# Patient Record
Sex: Male | Born: 1970
Health system: Southern US, Community
[De-identification: ages and names within clinical notes are randomized; demographics above are authoritative.]

## PROBLEM LIST (undated history)

## (undated) DIAGNOSIS — D649 Anemia, unspecified: Secondary | ICD-10-CM

## (undated) HISTORY — PX: NO PAST SURGERIES: SHX2092

## (undated) HISTORY — PX: WISDOM TOOTH EXTRACTION: SHX21

## (undated) HISTORY — DX: Anemia, unspecified: D64.9

---

## 1999-02-07 ENCOUNTER — Encounter: Payer: Self-pay | Admitting: Emergency Medicine

## 1999-02-07 ENCOUNTER — Emergency Department (HOSPITAL_COMMUNITY): Admission: EM | Admit: 1999-02-07 | Discharge: 1999-02-07 | Payer: Self-pay | Admitting: Emergency Medicine

## 1999-02-10 ENCOUNTER — Encounter: Admission: RE | Admit: 1999-02-10 | Discharge: 1999-02-10 | Payer: Self-pay | Admitting: Family Medicine

## 1999-03-01 ENCOUNTER — Encounter: Admission: RE | Admit: 1999-03-01 | Discharge: 1999-03-01 | Payer: Self-pay | Admitting: Family Medicine

## 2001-11-19 ENCOUNTER — Ambulatory Visit (HOSPITAL_COMMUNITY): Admission: RE | Admit: 2001-11-19 | Discharge: 2001-11-19 | Payer: Self-pay | Admitting: Internal Medicine

## 2011-10-04 ENCOUNTER — Ambulatory Visit (INDEPENDENT_AMBULATORY_CARE_PROVIDER_SITE_OTHER): Payer: Self-pay | Admitting: Family Medicine

## 2011-10-04 VITALS — BP 121/73 | HR 53 | Temp 97.8°F | Resp 18 | Ht 70.0 in | Wt 173.4 lb

## 2011-10-04 DIAGNOSIS — R1011 Right upper quadrant pain: Secondary | ICD-10-CM

## 2011-10-04 DIAGNOSIS — D72819 Decreased white blood cell count, unspecified: Secondary | ICD-10-CM

## 2011-10-04 LAB — POCT CBC
Granulocyte percent: 46.4 %G (ref 37–80)
HCT, POC: 45.2 % (ref 43.5–53.7)
Hemoglobin: 14.5 g/dL (ref 14.1–18.1)
Lymph, poc: 1.7 (ref 0.6–3.4)
MCH, POC: 28.4 pg (ref 27–31.2)
MCHC: 32.1 g/dL (ref 31.8–35.4)
MCV: 88.7 fL (ref 80–97)
MID (cbc): 0.3 (ref 0–0.9)
MPV: 9.3 fL (ref 0–99.8)
POC Granulocyte: 1.7 — AB (ref 2–6.9)
POC LYMPH PERCENT: 45.5 %L (ref 10–50)
POC MID %: 8.1 %M (ref 0–12)
Platelet Count, POC: 247 10*3/uL (ref 142–424)
RBC: 5.1 M/uL (ref 4.69–6.13)
RDW, POC: 13 %
WBC: 3.7 10*3/uL — AB (ref 4.6–10.2)

## 2011-10-04 LAB — LIPID PANEL
Cholesterol: 143 mg/dL (ref 0–200)
HDL: 51 mg/dL (ref >39)
LDL Cholesterol: 82 mg/dL (ref 0–99)
Total CHOL/HDL Ratio: 2.8 Ratio
Triglycerides: 49 mg/dL (ref <150)
VLDL: 10 mg/dL (ref 0–40)

## 2011-10-04 LAB — COMPREHENSIVE METABOLIC PANEL
ALT: 10 U/L (ref 0–53)
AST: 19 U/L (ref 0–37)
Albumin: 4.9 g/dL (ref 3.5–5.2)
Alkaline Phosphatase: 55 U/L (ref 39–117)
BUN: 5 mg/dL — ABNORMAL LOW (ref 6–23)
CO2: 31 mEq/L (ref 19–32)
Calcium: 10.7 mg/dL — ABNORMAL HIGH (ref 8.4–10.5)
Chloride: 103 mEq/L (ref 96–112)
Creat: 0.83 mg/dL (ref 0.50–1.35)
Glucose, Bld: 86 mg/dL (ref 70–99)
Potassium: 4.9 mEq/L (ref 3.5–5.3)
Sodium: 140 mEq/L (ref 135–145)
Total Bilirubin: 0.7 mg/dL (ref 0.3–1.2)
Total Protein: 7.3 g/dL (ref 6.0–8.3)

## 2011-10-04 LAB — POCT UA - MICROSCOPIC ONLY
Bacteria, U Microscopic: NEGATIVE
Casts, Ur, LPF, POC: NEGATIVE
Crystals, Ur, HPF, POC: NEGATIVE
Epithelial cells, urine per micros: NEGATIVE
Mucus, UA: NEGATIVE
RBC, urine, microscopic: NEGATIVE
WBC, Ur, HPF, POC: NEGATIVE
Yeast, UA: NEGATIVE

## 2011-10-04 LAB — POCT URINALYSIS DIPSTICK
Bilirubin, UA: NEGATIVE
Blood, UA: NEGATIVE
Glucose, UA: NEGATIVE
Ketones, UA: NEGATIVE
Leukocytes, UA: NEGATIVE
Nitrite, UA: NEGATIVE
Protein, UA: NEGATIVE
Spec Grav, UA: 1.015
Urobilinogen, UA: 0.2
pH, UA: 7

## 2011-10-04 NOTE — Progress Notes (Addendum)
Patient Name: Antonio Moore Date of Birth: 02/28/1971 Medical Record Number: [id number] Gender: male Date of Encounter: 10/04/2011  History of Present Illness:  Antonio Moore is a 41 y.o. very pleasant male patient who presents with the following:  Here today to evaluate right side pain.  He has noted some RUQ/ right lower rib pain for the last 2 days.  The pain is there all the time but can wax and wane.  No relationship to eating.   No cough, no fever.  He has noted some aches and has not felt great overall- however he has not been severely ill either.   No urinary symptoms.  No blood in his urine. Never had this before.   Not taking any medications for this so far.   Appetitie seems to be ok, but he has been eating carefully.  No nausea, vomiting or diarrhea.   No blood in his stool, he has not been taking excessive NSAIDs  Generally a healthy person- he has lost weight recently by adopting a mostly vegan diet He is not a smoker or an exesssive drinker  He would also like to have any other appropriate screening labs today if possible.  He has only eaten an orange so far today.   There is no problem list on file for this patient.  No past medical history on file. No past surgical history on file. History  Substance Use Topics  . Smoking status: Never Smoker   . Smokeless tobacco: Not on file  . Alcohol Use: Not on file   No family history on file. No Known Allergies  Medication list has been reviewed and updated.  Review of Systems: As per HPI- otherwise negative.  Physical Examination: Filed Vitals:   10/04/11 1348  BP: 121/73  Pulse: 53  Temp: 97.8 F (36.6 C)  TempSrc: Oral  Resp: 18  Height: 5\' 10"  (1.778 m)  Weight: 173 lb 6.4 oz (78.654 kg)    Body mass index is 24.88 kg/(m^2).  GEN: WDWN, NAD, Non-toxic, A & O x 3, looks well HEENT: Atraumatic, Normocephalic. Neck supple. No masses, No LAD.  TM and oropharynx wnl Ears and Nose: No external  deformity. CV: RRR, No M/G/R. No JVD. No thrill. No extra heart sounds. PULM: CTA B, no wheezes, crackles, rhonchi. No retractions. No resp. distress. No accessory muscle use. ABD: S, ND, +BS. No rebound. No HSM.  Mild epigastric tenderness, negative murphy's sign EXTR: No c/c/e NEURO Normal gait.  PSYCH: Normally interactive. Conversant. Not depressed or anxious appearing.  Calm demeanor.  No CVA tenderness  Results for orders placed in visit on 10/04/11  POCT URINALYSIS DIPSTICK      Component Value Range   Color, UA yellow     Clarity, UA clear     Glucose, UA neg     Bilirubin, UA neg     Ketones, UA neg     Spec Grav, UA 1.015     Blood, UA neg     pH, UA 7.0     Protein, UA neg     Urobilinogen, UA 0.2     Nitrite, UA neg     Leukocytes, UA Negative    POCT UA - MICROSCOPIC ONLY      Component Value Range   WBC, Ur, HPF, POC neg     RBC, urine, microscopic neg     Bacteria, U Microscopic neg     Mucus, UA neg     Epithelial cells, urine  per micros neg     Crystals, Ur, HPF, POC neg     Casts, Ur, LPF, POC neg     Yeast, UA neg    POCT CBC      Component Value Range   WBC 3.7 (*) 4.6 - 10.2 (K/uL)   Lymph, poc 1.7  0.6 - 3.4    POC LYMPH PERCENT 45.5  10 - 50 (%L)   MID (cbc) 0.3  0 - 0.9    POC MID % 8.1  0 - 12 (%M)   POC Granulocyte 1.7 (*) 2 - 6.9    Granulocyte percent 46.4  37 - 80 (%G)   RBC 5.10  4.69 - 6.13 (M/uL)   Hemoglobin 14.5  14.1 - 18.1 (g/dL)   HCT, POC 45.4  09.8 - 53.7 (%)   MCV 88.7  80 - 97 (fL)   MCH, POC 28.4  27 - 31.2 (pg)   MCHC 32.1  31.8 - 35.4 (g/dL)   RDW, POC 11.9     Platelet Count, POC 247  142 - 424 (K/uL)   MPV 9.3  0 - 99.8 (fL)    Assessment and Plan: 1. Abdominal pain, RUQ  POCT urinalysis dipstick, POCT UA - Microscopic Only, POCT CBC, Comprehensive metabolic panel, Lipid panel   Await other labs as above- will follow- up pending results.  Suspect thatt Muzamil has gastritis or possibly MSK abdominal wall pain.  As  his symptoms are fairly mild and labs are reassuring, he chose to observe as opposed to doing further evaluation at this time (he is self- pay).  He will take an OTC acid reducer such as tums or pepcid/ zantac. Discussed acidic foods to avoid.  He will call/ RTC if he is getting worse or having any more pain.  Otherwise I will call him in a day or so when his other labs are available.    5/9: called and left detailed message on machine- other labs look ok except mild elevation of calcium.  Will leave order for recheck CBC and Ca+ on chart.  If symptoms continue please call/ rtc- Sooner if worse.

## 2011-10-05 ENCOUNTER — Encounter: Payer: Self-pay | Admitting: Family Medicine

## 2011-10-05 NOTE — Progress Notes (Signed)
Addended by: Abbe Amsterdam C on: 10/05/2011 08:49 AM   Modules accepted: Orders

## 2012-10-09 ENCOUNTER — Encounter: Payer: Self-pay | Admitting: Family Medicine

## 2014-10-12 ENCOUNTER — Telehealth: Payer: Self-pay

## 2014-10-12 ENCOUNTER — Ambulatory Visit (INDEPENDENT_AMBULATORY_CARE_PROVIDER_SITE_OTHER): Payer: Managed Care, Other (non HMO) | Admitting: Emergency Medicine

## 2014-10-12 VITALS — BP 120/72 | HR 56 | Temp 98.4°F | Resp 18 | Ht 72.0 in | Wt 200.0 lb

## 2014-10-12 DIAGNOSIS — J0140 Acute pansinusitis, unspecified: Secondary | ICD-10-CM | POA: Diagnosis not present

## 2014-10-12 DIAGNOSIS — J209 Acute bronchitis, unspecified: Secondary | ICD-10-CM | POA: Diagnosis not present

## 2014-10-12 MED ORDER — HYDROCOD POLST-CPM POLST ER 10-8 MG/5ML PO SUER: 5.0000 mL | Freq: Two times a day (BID) | ORAL | Status: DC

## 2014-10-12 MED ORDER — PSEUDOEPHEDRINE-GUAIFENESIN ER 60-600 MG PO TB12: 1.0000 | ORAL_TABLET | Freq: Two times a day (BID) | ORAL | Status: AC

## 2014-10-12 MED ORDER — AMOXICILLIN-POT CLAVULANATE 875-125 MG PO TABS: 1.0000 | ORAL_TABLET | Freq: Two times a day (BID) | ORAL | Status: DC

## 2014-10-12 NOTE — Patient Instructions (Signed)

## 2014-10-12 NOTE — Progress Notes (Signed)
[facility] [address] [place] [address] [phone]  Date:  10/12/2014   Name:  Antonio Moore   DOB:  Jun 11, 1970   MRN:  [id number]  PCP:  No PCP Per Patient    Chief Complaint: Generalized Body Aches and Cough   History of Present Illness:  Antonio Moore is a 44 y.o. very pleasant male patient who presents with the following:  Ill for a week with nasal congestion and mucopurulent nasal drainage and post nasal drip. No sore throat. Has a cough productive purulent sputum.   No wheezing or shortness of breath Patient denies fever or chills. Patient denies nausea or vomiting No rash No improvement with over the counter medications or other home remedies.  Denies other complaint or health concern today.   There are no active problems to display for this patient.   History reviewed. No pertinent past medical history.  Past Surgical History  Procedure Laterality Date  . Wisdom tooth extraction      History  Substance Use Topics  . Smoking status: Never Smoker   . Smokeless tobacco: Not on file  . Alcohol Use: Not on file    Family History  Problem Relation Age of Onset  . Stroke Mother     Allergies  Allergen Reactions  . Bee Venom     Medication list has been reviewed and updated.  No current outpatient prescriptions on file prior to visit.   No current facility-administered medications on file prior to visit.    Review of Systems:  Review of Systems  Constitutional: Negative for fever, chills and fatigue.  HENT: Negative for congestion, ear pain, hearing loss, postnasal drip, rhinorrhea and sinus pressure.   Eyes: Negative for discharge and redness.  Respiratory: Negative for cough, shortness of breath and wheezing.   Cardiovascular: Negative for chest pain and leg swelling.  Gastrointestinal: Negative for nausea, vomiting, abdominal pain, constipation and blood in stool.  Genitourinary: Negative for dysuria, urgency and  frequency.  Musculoskeletal: Negative for neck stiffness.  Skin: Negative for rash.  Neurological: Negative for seizures, weakness and headaches.   Physical Examination: Filed Vitals:   10/12/14 1357  BP: 120/72  Pulse: 56  Temp: 98.4 F (36.9 C)  Resp: 18   Filed Vitals:   10/12/14 1357  Height: 6' (1.829 m)  Weight: 200 lb (90.719 kg)   Body mass index is 27.12 kg/(m^2). Ideal Body Weight: Weight in (lb) to have BMI = 25: 183.9  GEN: WDWN, NAD, Non-toxic, A & O x 3 HEENT: Atraumatic, Normocephalic. Neck supple. No masses, No LAD. Ears and Nose: No external deformity. CV: RRR, No M/G/R. No JVD. No thrill. No extra heart sounds. PULM: CTA B, no wheezes, crackles, rhonchi. No retractions. No resp. distress. No accessory muscle use. ABD: S, NT, ND, +BS. No rebound. No HSM. EXTR: No c/c/e NEURO Normal gait.  PSYCH: Normally interactive. Conversant. Not depressed or anxious appearing.  Calm demeanor.    Assessment and Plan: 1. Acute bronchitis, unspecified organism   - amoxicillin-clavulanate (AUGMENTIN) 875-125 MG per tablet; Take 1 tablet by mouth 2 (two) times daily.  Dispense: 20 tablet; Refill: 0 - pseudoephedrine-guaifenesin (MUCINEX D) 60-600 MG per tablet; Take 1 tablet by mouth every 12 (twelve) hours.  Dispense: 18 tablet; Refill: 0 - chlorpheniramine-HYDROcodone (TUSSIONEX PENNKINETIC ER) 10-8 MG/5ML SUER; Take 5 mLs by mouth 2 (two) times daily.  Dispense: 60 mL; Refill: 0  2. Acute pansinusitis, recurrence not specified   -  amoxicillin-clavulanate (AUGMENTIN) 875-125 MG per tablet; Take 1 tablet by mouth 2 (two) times daily.  Dispense: 20 tablet; Refill: 0 - pseudoephedrine-guaifenesin (MUCINEX D) 60-600 MG per tablet; Take 1 tablet by mouth every 12 (twelve) hours.  Dispense: 18 tablet; Refill: 0 - chlorpheniramine-HYDROcodone (TUSSIONEX PENNKINETIC ER) 10-8 MG/5ML SUER; Take 5 mLs by mouth 2 (two) times daily.  Dispense: 60 mL; Refill: 0    Signed Ellison Carwin, MD

## 2014-10-12 NOTE — Telephone Encounter (Signed)
Called pt, advised he would be contagious but if he feels well than he can just work and use sanitary hand measures to prevent transmission of infection. Advised to take the ABX as prescribed.

## 2014-10-12 NOTE — Telephone Encounter (Signed)
Patient is requesting information spreading sinusitis. Is It Contagious?   Patient was seen today and needs to inform his employer.  [phone] (H)

## 2016-05-16 ENCOUNTER — Ambulatory Visit (INDEPENDENT_AMBULATORY_CARE_PROVIDER_SITE_OTHER): Payer: PRIVATE HEALTH INSURANCE | Admitting: Family Medicine

## 2016-05-16 VITALS — BP 116/74 | HR 56 | Temp 98.5°F | Resp 18 | Ht 72.0 in | Wt 224.0 lb

## 2016-05-16 DIAGNOSIS — Z0000 Encounter for general adult medical examination without abnormal findings: Secondary | ICD-10-CM | POA: Diagnosis not present

## 2016-05-16 DIAGNOSIS — Z6830 Body mass index (BMI) 30.0-30.9, adult: Secondary | ICD-10-CM | POA: Diagnosis not present

## 2016-05-16 DIAGNOSIS — Z1329 Encounter for screening for other suspected endocrine disorder: Secondary | ICD-10-CM | POA: Diagnosis not present

## 2016-05-16 DIAGNOSIS — Z1383 Encounter for screening for respiratory disorder NEC: Secondary | ICD-10-CM

## 2016-05-16 DIAGNOSIS — Z136 Encounter for screening for cardiovascular disorders: Secondary | ICD-10-CM

## 2016-05-16 DIAGNOSIS — Z800 Family history of malignant neoplasm of digestive organs: Secondary | ICD-10-CM

## 2016-05-16 DIAGNOSIS — Z113 Encounter for screening for infections with a predominantly sexual mode of transmission: Secondary | ICD-10-CM

## 2016-05-16 DIAGNOSIS — Z130 Encounter for screening for diseases of the blood and blood-forming organs and certain disorders involving the immune mechanism: Secondary | ICD-10-CM

## 2016-05-16 DIAGNOSIS — E66811 Other obesity due to excess calories: Secondary | ICD-10-CM

## 2016-05-16 DIAGNOSIS — Z1389 Encounter for screening for other disorder: Secondary | ICD-10-CM

## 2016-05-16 DIAGNOSIS — E6609 Other obesity due to excess calories: Secondary | ICD-10-CM

## 2016-05-16 DIAGNOSIS — R1013 Epigastric pain: Secondary | ICD-10-CM

## 2016-05-16 LAB — POCT URINALYSIS DIP (MANUAL ENTRY)
BILIRUBIN UA: NEGATIVE
Glucose, UA: NEGATIVE
Ketones, POC UA: NEGATIVE
Leukocytes, UA: NEGATIVE
Nitrite, UA: NEGATIVE
PH UA: 6
Protein Ur, POC: NEGATIVE
RBC UA: NEGATIVE
SPEC GRAV UA: 1.015
Urobilinogen, UA: 0.2

## 2016-05-16 MED ORDER — PANTOPRAZOLE SODIUM 40 MG PO TBEC: 40.0000 mg | DELAYED_RELEASE_TABLET | Freq: Every day | ORAL | 1 refills | Status: DC

## 2016-05-16 NOTE — Progress Notes (Signed)
Subjective:  By signing my name below, I, Essence Howell, attest that this documentation has been prepared under the direction and in the presence of Delman Cheadle, MD Electronically Signed: Ladene Artist, ED Scribe 05/16/2016 at 8:32 AM.   Patient ID: Antonio Moore, male    DOB: 1970/12/01, 45 y.o.   MRN: [id number]  Chief Complaint  Patient presents with  . Annual Exam   HPI HPI Comments: Antonio Moore is a 45 y.o. male who presents to the [facility] for an annual exam.   Pt has noticed intermittent epigastric pain that radiates into his right chest over the past few months. Pt describes pain as pressure that seems to be exacerbated with eating but he denies irritation with consuming certain foods. He also reports gradual weight gain. Pt denies nausea, vomiting, changes in bowels or bladder, difficulty urinating, sob, any other chest pain. He reports experience heart burn once but none since. Pt denies h/o GERD, stomach ulcers or abdominal surgeries. He is a nonsmoker. Pt expresses his concerns since his brother recently passed from pancreatic caner at age 66 and was overweight.   Immunizations Pt's last tetanus is unknown. He declines the vaccine at this visit.   History reviewed. No pertinent past medical history. No current outpatient prescriptions on file prior to visit.   No current facility-administered medications on file prior to visit.    Allergies  Allergen Reactions  . Bee Venom    Past Surgical History:  Procedure Laterality Date  . WISDOM TOOTH EXTRACTION     Family History  Problem Relation Age of Onset  . Stroke Mother   . Cancer Brother    Social History   Social History  . Marital status: Married    Spouse name: N/A  . Number of children: N/A  . Years of education: N/A   Social History Main Topics  . Smoking status: Never Smoker  . Smokeless tobacco: Never Used  . Alcohol use No  . Drug use: No  . Sexual activity: Not Asked    Other Topics Concern  . None   Social History Narrative  . None   Depression screen [facility] 2/9 05/16/2016  Decreased Interest 0  Down, Depressed, Hopeless 0  PHQ - 2 Score 0    Review of Systems  HENT: Positive for sinus pressure.   Respiratory: Negative for shortness of breath.   Cardiovascular: Positive for chest pain.  Gastrointestinal: Positive for abdominal pain. Negative for blood in stool, constipation, diarrhea, nausea and vomiting.  Genitourinary: Negative for difficulty urinating.  All other systems reviewed and are negative.     Objective:   Physical Exam  Constitutional: He is oriented to person, place, and time. He appears well-developed and well-nourished. No distress.  HENT:  Head: Normocephalic and atraumatic.  Right Ear: Tympanic membrane normal.  Left Ear: Tympanic membrane normal.  Nose: Nose normal.  Mouth/Throat: Oropharynx is clear and moist.  Eyes: Conjunctivae and EOM are normal.  Neck: Neck supple. No tracheal deviation present.  Cardiovascular: Normal rate, regular rhythm, S1 normal and S2 normal.  Exam reveals no gallop and no friction rub.   No murmur heard. Pulmonary/Chest: Effort normal. No respiratory distress.  Good air movement. Lungs are clear to auscultation.  Abdominal: Soft. Bowel sounds are normal.  Musculoskeletal: Normal range of motion.  Lymphadenopathy:       Head (right side): Tonsillar (mild) adenopathy present.       Head (left side): Tonsillar (mild) adenopathy  present.    He has no cervical adenopathy.       Right cervical: No posterior cervical adenopathy present.      Left cervical: No posterior cervical adenopathy present.  Neurological: He is alert and oriented to person, place, and time.  Skin: Skin is warm and dry.  Psychiatric: He has a normal mood and affect. His behavior is normal.  Nursing note and vitals reviewed.  BP 116/74 (BP Location: Right Arm, Patient Position: Sitting, Cuff Size: Small)   Pulse (!) 56    Temp 98.5 F (36.9 C) (Oral)   Resp 18   Ht 6' (1.829 m)   Wt 224 lb (101.6 kg)   SpO2 99%   BMI 30.38 kg/m      Results for orders placed or performed in visit on 05/16/16  POCT urinalysis dipstick  Result Value Ref Range   Color, UA yellow yellow   Clarity, UA clear clear   Glucose, UA negative negative   Bilirubin, UA negative negative   Ketones, POC UA negative negative   Spec Grav, UA 1.015    Blood, UA negative negative   pH, UA 6.0    Protein Ur, POC negative negative   Urobilinogen, UA 0.2    Nitrite, UA Negative Negative   Leukocytes, UA Negative Negative    Assessment & Plan:   1. Annual physical exam   2. Screening for cardiovascular, respiratory, and genitourinary diseases   3. Screening for deficiency anemia   4. Screening for thyroid disorder   5. Abdominal pain, epigastric - start ppi, check labs and [place].  6. Class 1 obesity due to excess calories without serious comorbidity with body mass index (BMI) of 30.0 to 30.9 in adult - pt going to focus on diet/exercise  7. Screening for STD (sexually transmitted disease) - no risk, routine one time hiv screen  8. Family history of pancreatic cancer - in brother, pt now with similar sxs that are causing sig anxiety/concern so need to image pancreas, need to r/o cholelithiasis or fatty liver as pain etiology    Orders Placed This Encounter  Procedures  . US Abdomen Complete    EPIC ORDER WT-220/NO NEEDS [facility]    Standing Status:   Future    Standing Expiration Date:   07/17/2017    Order Specific Question:   Reason for Exam (SYMPTOM  OR DIAGNOSIS REQUIRED)    Answer:   epigastric and RUQ pain and fullness for mos, brother passed from pancreatic cancer with similar pain    Order Specific Question:   Preferred imaging location?    Answer:   [facility]  . CBC  . Comprehensive metabolic panel    Order Specific Question:   Has the patient fasted?    Answer:   Yes  . TSH  . Lipid  panel    Order Specific Question:   Has the patient fasted?    Answer:   Yes  . HIV antibody  . Lipase  . Care order/instruction:    AVS and GO    Scheduling Instructions:     AVS and GO  . POCT urinalysis dipstick     I personally performed the services described in this documentation, which was scribed in my presence. The recorded information has been reviewed and considered, and addended by me as needed.   Delman Cheadle, M.D.  [facility] [address] [place], [place] [address] [phone] phone [phone] fax  05/16/16 9:05 PM

## 2016-05-16 NOTE — Patient Instructions (Addendum)
IF you received an x-ray today, you will receive an invoice from [facility]. Please contact [facility] at [phone] with questions or concerns regarding your invoice.   IF you received labwork today, you will receive an invoice from [facility]. Please contact [facility] at [phone] with questions or concerns regarding your invoice.   Our billing staff will not be able to assist you with questions regarding bills from these companies.  You will be contacted with the lab results as soon as they are available. The fastest way to get your results is to activate your My Chart account. Instructions are located on the last page of this paperwork. If you have not heard from [place] regarding the results in 2 weeks, please contact this office.    Health Maintenance, Male A healthy lifestyle and preventative care can promote health and wellness.  Maintain regular health, dental, and eye exams.  Eat a healthy diet. Foods like vegetables, fruits, whole grains, low-fat dairy products, and lean protein foods contain the nutrients you need and are low in calories. Decrease your intake of foods high in solid fats, added sugars, and salt. Get information about a proper diet from your health care provider, if necessary.  Regular physical exercise is one of the most important things you can do for your health. Most adults should get at least 150 minutes of moderate-intensity exercise (any activity that increases your heart rate and causes you to sweat) each week. In addition, most adults need muscle-strengthening exercises on 2 or more days a week.   Maintain a healthy weight. The body mass index (BMI) is a screening tool to identify possible weight problems. It provides an estimate of body fat based on height and weight. Your health care provider can find your BMI and can help you achieve or maintain a healthy weight. For males 20 years and older:  A BMI below 18.5 is considered  underweight.  A BMI of 18.5 to 24.9 is normal.  A BMI of 25 to 29.9 is considered overweight.  A BMI of 30 and above is considered obese.  Maintain normal blood lipids and cholesterol by exercising and minimizing your intake of saturated fat. Eat a balanced diet with plenty of fruits and vegetables. Blood tests for lipids and cholesterol should begin at age 90 and be repeated every 5 years. If your lipid or cholesterol levels are high, you are over age 51, or you are at high risk for heart disease, you may need your cholesterol levels checked more frequently.Ongoing high lipid and cholesterol levels should be treated with medicines if diet and exercise are not working.  If you smoke, find out from your health care provider how to quit. If you do not use tobacco, do not start.  Lung cancer screening is recommended for adults aged 57-80 years who are at high risk for developing lung cancer because of a history of smoking. A yearly low-dose CT scan of the lungs is recommended for people who have at least a 30-pack-year history of smoking and are current smokers or have quit within the past 15 years. A pack year of smoking is smoking an average of 1 pack of cigarettes a day for 1 year (for example, a 30-pack-year history of smoking could mean smoking 1 pack a day for 30 years or 2 packs a day for 15 years). Yearly screening should continue until the smoker has stopped smoking for at least 15 years. Yearly screening should be stopped for people who  develop a health problem that would prevent them from having lung cancer treatment.  If you choose to drink alcohol, do not have more than 2 drinks per day. One drink is considered to be 12 oz (360 mL) of beer, 5 oz (150 mL) of wine, or 1.5 oz (45 mL) of liquor.  Avoid the use of street drugs. Do not share needles with anyone. Ask for help if you need support or instructions about stopping the use of drugs.  High blood pressure causes heart disease and  increases the risk of stroke. High blood pressure is more likely to develop in:  People who have blood pressure in the end of the normal range (100-139/85-89 mm Hg).  People who are overweight or obese.  People who are African American.  If you are 29-77 years of age, have your blood pressure checked every 3-5 years. If you are 16 years of age or older, have your blood pressure checked every year. You should have your blood pressure measured twice-once when you are at a hospital or clinic, and once when you are not at a hospital or clinic. Record the average of the two measurements. To check your blood pressure when you are not at a hospital or clinic, you can use:  An automated blood pressure machine at a pharmacy.  A home blood pressure monitor.  If you are 33-49 years old, ask your health care provider if you should take aspirin to prevent heart disease.  Diabetes screening involves taking a blood sample to check your fasting blood sugar level. This should be done once every 3 years after age 8 if you are at a normal weight and without risk factors for diabetes. Testing should be considered at a younger age or be carried out more frequently if you are overweight and have at least 1 risk factor for diabetes.  Colorectal cancer can be detected and often prevented. Most routine colorectal cancer screening begins at the age of 80 and continues through age 26. However, your health care provider may recommend screening at an earlier age if you have risk factors for colon cancer. On a yearly basis, your health care provider may provide home test kits to check for hidden blood in the stool. A small camera at the end of a tube may be used to directly examine the colon (sigmoidoscopy or colonoscopy) to detect the earliest forms of colorectal cancer. Talk to your health care provider about this at age 76 when routine screening begins. A direct exam of the colon should be repeated every 5-10 years through  age 72, unless early forms of precancerous polyps or small growths are found.  People who are at an increased risk for hepatitis B should be screened for this virus. You are considered at high risk for hepatitis B if:  You were born in a country where hepatitis B occurs often. Talk with your health care provider about which countries are considered high risk.  Your parents were born in a high-risk country and you have not received a shot to protect against hepatitis B (hepatitis B vaccine).  You have HIV or AIDS.  You use needles to inject street drugs.  You live with, or have sex with, someone who has hepatitis B.  You are a man who has sex with other men (MSM).  You get hemodialysis treatment.  You take certain medicines for conditions like cancer, organ transplantation, and autoimmune conditions.  Hepatitis C blood testing is recommended for all people born  from [place] through 1965 and any individual with known risk factors for hepatitis C.  Healthy men should no longer receive prostate-specific antigen (PSA) blood tests as part of routine cancer screening. Talk to your health care provider about prostate cancer screening.  Testicular cancer screening is not recommended for adolescents or adult males who have no symptoms. Screening includes self-exam, a health care provider exam, and other screening tests. Consult with your health care provider about any symptoms you have or any concerns you have about testicular cancer.  Practice safe sex. Use condoms and avoid high-risk sexual practices to reduce the spread of sexually transmitted infections (STIs).  You should be screened for STIs, including gonorrhea and chlamydia if:  You are sexually active and are younger than 24 years.  You are older than 24 years, and your health care provider tells you that you are at risk for this type of infection.  Your sexual activity has changed since you were last screened, and you are at an  increased risk for chlamydia or gonorrhea. Ask your health care provider if you are at risk.  If you are at risk of being infected with HIV, it is recommended that you take a prescription medicine daily to prevent HIV infection. This is called pre-exposure prophylaxis (PrEP). You are considered at risk if:  You are a man who has sex with other men (MSM).  You are a heterosexual man who is sexually active with multiple partners.  You take drugs by injection.  You are sexually active with a partner who has HIV.  Talk with your health care provider about whether you are at high risk of being infected with HIV. If you choose to begin PrEP, you should first be tested for HIV. You should then be tested every 3 months for as long as you are taking PrEP.  Use sunscreen. Apply sunscreen liberally and repeatedly throughout the day. You should seek shade when your shadow is shorter than you. Protect yourself by wearing long sleeves, pants, a wide-brimmed hat, and sunglasses year round whenever you are outdoors.  Tell your health care provider of new moles or changes in moles, especially if there is a change in shape or color. Also, tell your health care provider if a mole is larger than the size of a pencil eraser.  A one-time screening for abdominal aortic aneurysm (AAA) and surgical repair of large AAAs by ultrasound is recommended for men aged 59-75 years who are current or former smokers.  Stay current with your vaccines (immunizations). This information is not intended to replace advice given to you by your health care provider. Make sure you discuss any questions you have with your health care provider. Document Released: 11/11/2007 Document Revised: 06/05/2014 Document Reviewed: 02/16/2015 Elsevier Interactive Patient Education  2017 [employer].

## 2016-05-17 LAB — LIPID PANEL
CHOLESTEROL TOTAL: 165 mg/dL (ref 100–199)
Chol/HDL Ratio: 3 ratio units (ref 0.0–5.0)
HDL: 55 mg/dL (ref >39)
LDL Calculated: 96 mg/dL (ref 0–99)
Triglycerides: 72 mg/dL (ref 0–149)
VLDL Cholesterol Cal: 14 mg/dL (ref 5–40)

## 2016-05-17 LAB — COMPREHENSIVE METABOLIC PANEL
A/G RATIO: 1.8 (ref 1.2–2.2)
ALT: 15 IU/L (ref 0–44)
AST: 20 IU/L (ref 0–40)
Albumin: 4.6 g/dL (ref 3.5–5.5)
Alkaline Phosphatase: 104 IU/L (ref 39–117)
BUN/Creatinine Ratio: 13 (ref 9–20)
BUN: 12 mg/dL (ref 6–24)
Bilirubin Total: 0.4 mg/dL (ref 0.0–1.2)
CALCIUM: 9.2 mg/dL (ref 8.7–10.2)
CHLORIDE: 97 mmol/L (ref 96–106)
CO2: 27 mmol/L (ref 18–29)
Creatinine, Ser: 0.92 mg/dL (ref 0.76–1.27)
GFR calc Af Amer: 115 mL/min/{1.73_m2} (ref >59)
GFR, EST NON AFRICAN AMERICAN: 99 mL/min/{1.73_m2} (ref >59)
GLUCOSE: 93 mg/dL (ref 65–99)
Globulin, Total: 2.5 g/dL (ref 1.5–4.5)
Potassium: 4.6 mmol/L (ref 3.5–5.2)
Sodium: 140 mmol/L (ref 134–144)
TOTAL PROTEIN: 7.1 g/dL (ref 6.0–8.5)

## 2016-05-17 LAB — CBC
Hematocrit: 42.2 % (ref 37.5–51.0)
Hemoglobin: 14.2 g/dL (ref 13.0–17.7)
MCH: 29.3 pg (ref 26.6–33.0)
MCHC: 33.6 g/dL (ref 31.5–35.7)
MCV: 87 fL (ref 79–97)
PLATELETS: 216 10*3/uL (ref 150–379)
RBC: 4.85 x10E6/uL (ref 4.14–5.80)
RDW: 14.4 % (ref 12.3–15.4)
WBC: 4.4 10*3/uL (ref 3.4–10.8)

## 2016-05-17 LAB — HIV ANTIBODY (ROUTINE TESTING W REFLEX): HIV Screen 4th Generation wRfx: NONREACTIVE

## 2016-05-17 LAB — TSH: TSH: 2.81 u[IU]/mL (ref 0.450–4.500)

## 2016-05-17 LAB — LIPASE: Lipase: 20 U/L (ref 13–78)

## 2016-05-30 ENCOUNTER — Ambulatory Visit
Admission: RE | Admit: 2016-05-30 | Discharge: 2016-05-30 | Disposition: A | Discharge status: Home / Self Care | Payer: PRIVATE HEALTH INSURANCE | Source: Ambulatory Visit | Attending: Family Medicine | Admitting: Family Medicine

## 2016-05-30 DIAGNOSIS — R1013 Epigastric pain: Secondary | ICD-10-CM

## 2016-05-30 DIAGNOSIS — Z800 Family history of malignant neoplasm of digestive organs: Secondary | ICD-10-CM

## 2017-12-20 ENCOUNTER — Ambulatory Visit (INDEPENDENT_AMBULATORY_CARE_PROVIDER_SITE_OTHER): Payer: BC Managed Care – PPO | Admitting: Family Medicine

## 2017-12-20 ENCOUNTER — Encounter: Payer: Self-pay | Admitting: Family Medicine

## 2017-12-20 ENCOUNTER — Other Ambulatory Visit: Payer: Self-pay

## 2017-12-20 VITALS — BP 112/71 | HR 60 | Temp 97.7°F | Ht 72.0 in | Wt 201.8 lb

## 2017-12-20 DIAGNOSIS — Z0000 Encounter for general adult medical examination without abnormal findings: Secondary | ICD-10-CM

## 2017-12-20 DIAGNOSIS — Z136 Encounter for screening for cardiovascular disorders: Secondary | ICD-10-CM

## 2017-12-20 DIAGNOSIS — Z1389 Encounter for screening for other disorder: Secondary | ICD-10-CM

## 2017-12-20 DIAGNOSIS — Z1383 Encounter for screening for respiratory disorder NEC: Secondary | ICD-10-CM | POA: Diagnosis not present

## 2017-12-20 DIAGNOSIS — Z125 Encounter for screening for malignant neoplasm of prostate: Secondary | ICD-10-CM

## 2017-12-20 DIAGNOSIS — Z800 Family history of malignant neoplasm of digestive organs: Secondary | ICD-10-CM

## 2017-12-20 DIAGNOSIS — Z23 Encounter for immunization: Secondary | ICD-10-CM | POA: Diagnosis not present

## 2017-12-20 DIAGNOSIS — Z1329 Encounter for screening for other suspected endocrine disorder: Secondary | ICD-10-CM

## 2017-12-20 DIAGNOSIS — H6121 Impacted cerumen, right ear: Secondary | ICD-10-CM

## 2017-12-20 DIAGNOSIS — Z130 Encounter for screening for diseases of the blood and blood-forming organs and certain disorders involving the immune mechanism: Secondary | ICD-10-CM

## 2017-12-20 LAB — POCT URINALYSIS DIP (MANUAL ENTRY)
BILIRUBIN UA: NEGATIVE
BILIRUBIN UA: NEGATIVE mg/dL
Blood, UA: NEGATIVE
GLUCOSE UA: NEGATIVE mg/dL
LEUKOCYTES UA: NEGATIVE
Nitrite, UA: NEGATIVE
Protein Ur, POC: NEGATIVE mg/dL
SPEC GRAV UA: 1.025 (ref 1.010–1.025)
Urobilinogen, UA: 0.2 E.U./dL
pH, UA: 7 (ref 5.0–8.0)

## 2017-12-20 NOTE — Patient Instructions (Signed)

## 2017-12-20 NOTE — Progress Notes (Addendum)
Subjective:  By signing my name below, I, Antonio Moore, attest that this documentation has been prepared under the direction and in the presence of Antonio Cheadle, MD. Electronically Signed: [name], [place]. 12/20/2017 , 4:48 PM .  Patient was seen in Room 1 .   Patient ID: HAPPY [place], male    DOB: 04/05/1971, 47 y.o.   MRN: [id number] Chief Complaint  Patient presents with  . Annual Exam    pt states he is doing well, he is not fasting    HPI  He is not fasting today.   Lipid screening Lab Results  Component Value Date   CHOL 165 05/16/2016   HDL 55 05/16/2016   LDLCALC 96 05/16/2016   TRIG 72 05/16/2016   CHOLHDL 3.0 05/16/2016    Primary Preventative Screenings: Prostate Cancer: He agrees to prostate exam today.  Family history: brother passed away from pancreatic cancer 2 years ago.  STI screening:  Tobacco use/AAA/Lung/EtOH/Illicit substances:  Cardiac: He mentioned a history of fluttery palpitations, but noted he was under a lot of stress and lack of rest at that time; symptoms resolved after rest.  Weight/Moore sugar/Diet/Exercise: He's lost around 20 lbs, and working to return to his baseline of 180s. He's been doing intermittent fasting. He just started exercising again about 3 weeks ago.  BMI Readings from Last 3 Encounters:  12/20/17 27.37 kg/m  05/16/16 30.38 kg/m  10/12/14 27.12 kg/m    OTC/Vit/Supp/Herbal: he takes omega XL supplement, similar to fish oil.  Dentist/Optho: he wears contacts, followed by eye doctor Immunizations:   There is no immunization history on file for this patient.   Tetanus vaccine: He doesn't recall his last tetanus shot.   Chronic Medical Conditions: Decreased sleep: he's been busy over the past week with high school reunions and other social outings recently.   History of abdominal strain: He was working out and doing a lunge a few years ago, felt like he pulled something. He denies seeing a bulge in his abdomen. He  denies any abdominal symptoms currently.   No past medical history on file. Past Surgical History:  Procedure Laterality Date  . WISDOM TOOTH EXTRACTION     No current outpatient medications on file prior to visit.   No current facility-administered medications on file prior to visit.    Allergies  Allergen Reactions  . Bee Venom   . Wasp Venom    Family History  Problem Relation Age of Onset  . Stroke Mother   . Stroke Father   . Cancer Brother    Social History   Socioeconomic History  . Marital status: Married    Spouse name: Not on file  . Number of children: Not on file  . Years of education: Not on file  . Highest education level: Not on file  Occupational History  . Not on file  Social Needs  . Financial resource strain: Not on file  . Food insecurity:    Worry: Not on file    Inability: Not on file  . Transportation needs:    Medical: Not on file    Non-medical: Not on file  Tobacco Use  . Smoking status: Never Smoker  . Smokeless tobacco: Never Used  Substance and Sexual Activity  . Alcohol use: No  . Drug use: No  . Sexual activity: Not on file  Lifestyle  . Physical activity:    Days per week: Not on file    Minutes per session: Not on file  .  Stress: Not on file  Relationships  . Social connections:    Talks on phone: Not on file    Gets together: Not on file    Attends religious service: Not on file    Active member of club or organization: Not on file    Attends meetings of clubs or organizations: Not on file    Relationship status: Not on file  Other Topics Concern  . Not on file  Social History Narrative  . Not on file   Depression screen [facility] 2/9 12/20/2017 05/16/2016  Decreased Interest 0 0  Down, Depressed, Hopeless 0 0  PHQ - 2 Score 0 0   Review of Systems  Constitutional: Negative for fatigue and unexpected weight change.  Eyes: Negative for visual disturbance.  Respiratory: Negative for cough, chest tightness and shortness  of breath.   Cardiovascular: Negative for chest pain, palpitations and leg swelling.  Gastrointestinal: Negative for abdominal pain and Moore in stool.  Neurological: Negative for dizziness, light-headedness and headaches.  All other systems reviewed and are negative.      Objective:   Physical Exam  Constitutional: He is oriented to person, place, and time. He appears well-developed and well-nourished. No distress.  HENT:  Head: Normocephalic and atraumatic.  Right ear: ball of cerumen   Eyes: Pupils are equal, round, and reactive to light. EOM are normal.  Neck: Neck supple.  Cardiovascular: Normal rate and regular rhythm.  Pulmonary/Chest: Effort normal and breath sounds normal. No respiratory distress.  Abdominal: There is negative Murphy's sign. Hernia confirmed negative in the right inguinal area and confirmed negative in the left inguinal area.  Genitourinary: Prostate normal. Prostate is not enlarged and not tender.  Musculoskeletal: Normal range of motion.  Lymphadenopathy: No inguinal adenopathy noted on the right or left side.  Neurological: He is alert and oriented to person, place, and time.  Skin: Skin is warm and dry.  Psychiatric: He has a normal mood and affect. His behavior is normal.  Nursing note and vitals reviewed.   BP 112/71 (BP Location: Right Arm, Patient Position: Sitting, Cuff Size: Normal)   Pulse 60   Temp 97.7 F (36.5 C) (Oral)   Ht 6' (1.829 m)   Wt 201 lb 12.8 oz (91.5 kg)   SpO2 100%   BMI 27.37 kg/m    Wt Readings from Last 3 Encounters:  12/20/17 201 lb 12.8 oz (91.5 kg)  05/16/16 224 lb (101.6 kg)  10/12/14 200 lb (90.7 kg)    Visual Acuity Screening   Right eye Left eye Both eyes  Without correction:     With correction: 20/13 20/13 20/13    EKG: Sinus Bradycardia HR 51 bpm, no acute ischemic changes noted. No prior EKG for comparison.  I have personally reviewed the EKG tracing and agree with the computer interpretation. Sinus   Bradycardia  WITHIN NORMAL LIMITS     Assessment & Plan:    1. Annual physical exam   2. Screening for cardiovascular, respiratory, and genitourinary diseases   3. Screening for prostate cancer   4. Family history of pancreatic cancer   5. Screening for deficiency anemia   6. Screening for thyroid disorder   7. Cerumen debris on tympanic membrane of right ear     Orders Placed This Encounter  Procedures  . Tdap vaccine greater than or equal to 7yo IM  . TSH  . Comprehensive metabolic panel  . CBC  . Lipase  . PSA  . PSA  . Lipase  .  POCT urinalysis dipstick  . EKG 12-Lead     I personally performed the services described in this documentation, which was scribed in my presence. The recorded information has been reviewed and considered, and addended by me as needed.   Antonio Moore, M.D.  Primary Care at [facility] [address] [place], [place] [address] [phone] phone [phone] fax  02/05/18 12:16 AM

## 2017-12-21 LAB — PSA: PROSTATE SPECIFIC AG, SERUM: 0.6 ng/mL (ref 0.0–4.0)

## 2017-12-21 LAB — COMPREHENSIVE METABOLIC PANEL
A/G RATIO: 2 (ref 1.2–2.2)
ALBUMIN: 4.5 g/dL (ref 3.5–5.5)
ALT: 12 IU/L (ref 0–44)
AST: 18 IU/L (ref 0–40)
Alkaline Phosphatase: 61 IU/L (ref 39–117)
BUN / CREAT RATIO: 13 (ref 9–20)
BUN: 12 mg/dL (ref 6–24)
Bilirubin Total: 0.2 mg/dL (ref 0.0–1.2)
CALCIUM: 9.4 mg/dL (ref 8.7–10.2)
CO2: 27 mmol/L (ref 20–29)
CREATININE: 0.96 mg/dL (ref 0.76–1.27)
Chloride: 104 mmol/L (ref 96–106)
GFR calc Af Amer: 108 mL/min/{1.73_m2} (ref >59)
GFR, EST NON AFRICAN AMERICAN: 93 mL/min/{1.73_m2} (ref >59)
GLOBULIN, TOTAL: 2.3 g/dL (ref 1.5–4.5)
Glucose: 86 mg/dL (ref 65–99)
POTASSIUM: 4.2 mmol/L (ref 3.5–5.2)
SODIUM: 143 mmol/L (ref 134–144)
Total Protein: 6.8 g/dL (ref 6.0–8.5)

## 2017-12-21 LAB — CBC
Hematocrit: 41.6 % (ref 37.5–51.0)
Hemoglobin: 13.9 g/dL (ref 13.0–17.7)
MCH: 30.2 pg (ref 26.6–33.0)
MCHC: 33.4 g/dL (ref 31.5–35.7)
MCV: 90 fL (ref 79–97)
PLATELETS: 218 10*3/uL (ref 150–450)
RBC: 4.6 x10E6/uL (ref 4.14–5.80)
RDW: 13.8 % (ref 12.3–15.4)
WBC: 4.2 10*3/uL (ref 3.4–10.8)

## 2017-12-21 LAB — TSH: TSH: 3.12 u[IU]/mL (ref 0.450–4.500)

## 2017-12-21 LAB — LIPASE: Lipase: 27 U/L (ref 13–78)

## 2018-01-30 ENCOUNTER — Ambulatory Visit: Payer: Self-pay | Admitting: *Deleted

## 2018-01-30 ENCOUNTER — Telehealth: Payer: Self-pay | Admitting: Family Medicine

## 2018-01-30 NOTE — Telephone Encounter (Signed)
Attempted to contact pt to assess symptoms; left message on voicemail [phone]; unable to complete nurse triage at this time

## 2018-01-30 NOTE — Telephone Encounter (Signed)
Pt called with C/O soreness at injection site rt shoulder from Tdap given in July at his office visit. Pt states the pain does not keep him from activity but he is concerned that it has lingered this long.  He also describes marks from the [place] that have remained.Pt denies redness and swelling at the site. He denies having drainage from the injection site at any time. He never had a fever but had a 24 hour time he just didn't feel well. Appointment made and care advice given.  Reason for Disposition . [1] Pain, tenderness, or swelling at the injection site AND [2] persists > 3 days  Answer Assessment - Initial Assessment Questions 1. SYMPTOMS: "What is the main symptom?" (e.g., redness, swelling, pain)      Pain in arm after immunization tetnus 2. ONSET: "When was the vaccine (shot) given?" "How much later did the Tdap__ begin?" (e.g., hours, days ago)      Next day 3. SEVERITY: "How bad is it?"      Sore something he notices does not keep him from activity 4. FEVER: "Is there a fever?" If so, ask: "What is it, how was it measured, and when did it start?"      Pt states he didn't check but felt bad for a day 5. IMMUNIZATIONS GIVEN: "What shots have you recently received?"     T dap 6. PAST REACTIONS: "Have you reacted to immunizations before?" If so, ask: "What happened?"     none 7. OTHER SYMPTOMS: "Do you have any other symptoms?"     Tape marks on arm and muscle is still sore no warmth no swelling. Some itching form tape marks but has since went away discolored at tape marks  Protocols used: IMMUNIZATION REACTIONS-A-AH

## 2018-01-30 NOTE — Telephone Encounter (Signed)
Pt is requesting lab results from last office visit July.  He is coming in for appointment 02/01/18 with W McVey PA.

## 2018-02-01 ENCOUNTER — Encounter: Payer: Self-pay | Admitting: Physician Assistant

## 2018-02-01 ENCOUNTER — Ambulatory Visit: Payer: Self-pay | Admitting: Physician Assistant

## 2018-02-01 ENCOUNTER — Ambulatory Visit (INDEPENDENT_AMBULATORY_CARE_PROVIDER_SITE_OTHER): Payer: BC Managed Care – PPO | Admitting: Physician Assistant

## 2018-02-01 ENCOUNTER — Other Ambulatory Visit: Payer: Self-pay

## 2018-02-01 VITALS — BP 120/74 | HR 66 | Temp 98.8°F | Resp 16 | Ht 71.0 in | Wt 216.0 lb

## 2018-02-01 DIAGNOSIS — Z5321 Procedure and treatment not carried out due to patient leaving prior to being seen by health care provider: Secondary | ICD-10-CM

## 2018-02-01 NOTE — Patient Instructions (Signed)
° ° ° °  If you have lab work done today you will be contacted with your lab results within the next 2 weeks.  If you have not heard from us then please contact us. The fastest way to get your results is to register for My Chart. ° ° °IF you received an x-ray today, you will receive an invoice from [facility]. Please contact [facility] at [phone] with questions or concerns regarding your invoice.  ° °IF you received labwork today, you will receive an invoice from [facility]. Please contact [facility] at [phone] with questions or concerns regarding your invoice.  ° °Our billing staff will not be able to assist you with questions regarding bills from these companies. ° °You will be contacted with the lab results as soon as they are available. The fastest way to get your results is to activate your My Chart account. Instructions are located on the last page of this paperwork. If you have not heard from us regarding the results in 2 weeks, please contact this office. °  ° ° ° °

## 2018-02-01 NOTE — Progress Notes (Signed)
Pt left without being seen.

## 2018-02-05 ENCOUNTER — Other Ambulatory Visit: Payer: Self-pay

## 2018-02-05 ENCOUNTER — Ambulatory Visit (INDEPENDENT_AMBULATORY_CARE_PROVIDER_SITE_OTHER): Payer: BC Managed Care – PPO | Admitting: Physician Assistant

## 2018-02-05 ENCOUNTER — Encounter: Payer: Self-pay | Admitting: Physician Assistant

## 2018-02-05 VITALS — BP 121/73 | HR 61 | Temp 98.4°F | Ht 71.0 in | Wt 217.2 lb

## 2018-02-05 DIAGNOSIS — M25511 Pain in right shoulder: Secondary | ICD-10-CM

## 2018-02-05 DIAGNOSIS — M791 Myalgia, unspecified site: Secondary | ICD-10-CM | POA: Diagnosis not present

## 2018-02-05 NOTE — Telephone Encounter (Signed)
Pt advised of labs at [facility].

## 2018-02-05 NOTE — Progress Notes (Signed)
   Antonio Moore  MRN: [id number] DOB: 01/23/1971  PCP: Patient, No Pcp Per  Subjective:  Pt is a 47 year old male who presents to clinic for pain of injection site.  Pt was here 11/2017 for annual exam and received tdap injection of left deltoid. He endorses pain since that time.  When he first took his bandaid off, his skin was red, bumpy and raised where the bandaid had been. The area has improved since that time, however he endorses darker area Denies decreased ROM, swelling, redness, joint pain.   Review of Systems  Musculoskeletal: Positive for arthralgias. Negative for joint swelling.  Skin: Positive for color change.    There are no active problems to display for this patient.   No current outpatient medications on file prior to visit.   No current facility-administered medications on file prior to visit.     Allergies  Allergen Reactions  . Bee Venom   . Wasp Venom      Objective:  BP 121/73 (BP Location: Right Arm, Patient Position: Sitting, Cuff Size: Normal)   Pulse 61   Temp 98.4 F (36.9 C) (Oral)   Ht 5\' 11"  (1.803 m)   Wt 217 lb 3.2 oz (98.5 kg)   SpO2 99%   BMI 30.29 kg/m   Physical Exam  Constitutional: He appears well-developed and well-nourished.  Musculoskeletal:       Arms: Skin: Skin is warm and dry.     Psychiatric: He has a normal mood and affect. His behavior is normal. Judgment and thought content normal.  Vitals reviewed.   A trigger point injection was performed at the site of maximal tenderness. This was well tolerated, and followed by 50% relief of pain.  Assessment and Plan :  1. Muscle pain 2. Trigger point of right shoulder region - pt presents c/o right shoulder pain following tdap injection after his annual exam. Sight of tenderness is inferior to injection site, cannot confirm whether injection was the cause. Dry needling performed., well tolerated and followed by 50% improvement of his discomfort. Advised hydration, heat  and massage. RTC if no improvement. There is an area of discoloration at bandage site, suspect skin irritation that will improve over time. He declines referral to derm.  - Inject trigger point, 1 or 2  Whitney Heyden Jaber, PA-C  Primary Care at [facility] 02/05/2018 1:46 PM  Please note: Portions of this report may have been transcribed using dragon voice recognition software. Every effort was made to ensure accuracy; however, inadvertent computerized transcription errors may be present.

## 2018-02-05 NOTE — Patient Instructions (Addendum)
Meloxiam is an NSAID. Do not use with any other otc pain medication other than tylenol/acetaminophen - so no aleve, ibuprofen, motrin, advil, etc.  Flexeril is a muscle relaxer. This may make you drowsy.   Apply moist heat to the area. Wet a towel and wring it out so it is damp. Put it in the microwave for about 15-20 seconds - long enough to make it hot, but not too hot to apply to your skin causing burns. Do this for about 20-30 minutes, 3-4 times a day.   Perform gentle, light stretches 2-3 times a day.   Gentle massage the area with a foam roller.   Stay well hydrated - try to drink 32-64 oz/day.    Trigger Point Dry Needling   What is Trigger Point Dry Needling (DN)?   1. DN is a physical therapy technique used to treat muscle pain and Dysfunction.  Specifically, DN helps deactivate muscle trigger points (Muscle Knots).   2. A thin filiform needle is used to penetrate the skin and stimulate the underlying trigger point.  The goal is for a local twitch response (LTR) to occur and for the trigger point to relax.  No medication of any kind is injected during the procedure.   What Does Trigger Point Dry Needling Feel Like?   1. The procedures feels different for each individual patient.   Some patients report that they do not actually feel the needle enter the skin and overall the process is not  painful.  Very  mild bleeding may occur.  However, many patients feel a deep cramping in the muscle in which the needle was inserted. This is the local twitch response.    How Will I Feel After The Treatment?   1. Soreness is normal, and the onset of soreness may not occur for a few hours.  Typically this soreness does not last longer than two days.   2. Bruising is uncommon, however; ice can be used to decrease any possible bruising.   3. In rare cases feeling tired or nauseous after the treatment is normal.  In addition, your symptoms may get worse before they get better, this period will  typically not last longer than 24 hours.   What Can I do After My Treatment?   1.  Increase your hydration by drinking more water for the next 24 hours.   2.  You may place ice or heat on the areas treated that have become sore, however don not use heat on inflamed or bruised areas.  Heat often brings more relief post needling.   3. You can continue your regular activities, but vigorous activity is not recommended initially after the treatment for 24 hours.   4. DN is best combined with other physical therapy such as strengthening, stretching, and other therapies.    IF you received an x-ray today, you will receive an invoice from [facility]. Please contact [facility] at [phone] with questions or concerns regarding your invoice.   IF you received labwork today, you will receive an invoice from [place]. Please contact [facility] at [phone] with questions or concerns regarding your invoice.   Our billing staff will not be able to assist you with questions regarding bills from these companies.  You will be contacted with the lab results as soon as they are available. The fastest way to get your results is to activate your My Chart account. Instructions are located on the last page of this paperwork. If you have  not heard from [place] regarding the results in 2 weeks, please contact this office.       IF you received an x-ray today, you will receive an invoice from [facility]. Please contact [facility] at [phone] with questions or concerns regarding your invoice.   IF you received labwork today, you will receive an invoice from [place]. Please contact [facility] at [phone] with questions or concerns regarding your invoice.   Our billing staff will not be able to assist you with questions regarding bills from these companies.  You will be contacted with the lab results as soon as they are available. The fastest way to get your results is to  activate your My Chart account. Instructions are located on the last page of this paperwork. If you have not heard from [place] regarding the results in 2 weeks, please contact this office.

## 2019-01-02 ENCOUNTER — Encounter: Payer: BC Managed Care – PPO | Admitting: Family Medicine

## 2019-01-13 ENCOUNTER — Encounter: Payer: Self-pay | Admitting: Emergency Medicine

## 2019-01-13 ENCOUNTER — Ambulatory Visit (INDEPENDENT_AMBULATORY_CARE_PROVIDER_SITE_OTHER): Payer: BC Managed Care – PPO | Admitting: Emergency Medicine

## 2019-01-13 ENCOUNTER — Other Ambulatory Visit: Payer: Self-pay

## 2019-01-13 VITALS — BP 116/72 | HR 52 | Temp 97.4°F | Ht 71.0 in | Wt 172.4 lb

## 2019-01-13 DIAGNOSIS — Z0000 Encounter for general adult medical examination without abnormal findings: Secondary | ICD-10-CM

## 2019-01-13 DIAGNOSIS — Z13220 Encounter for screening for lipoid disorders: Secondary | ICD-10-CM

## 2019-01-13 DIAGNOSIS — Z130 Encounter for screening for diseases of the blood and blood-forming organs and certain disorders involving the immune mechanism: Secondary | ICD-10-CM

## 2019-01-13 DIAGNOSIS — Z1329 Encounter for screening for other suspected endocrine disorder: Secondary | ICD-10-CM

## 2019-01-13 DIAGNOSIS — Z13228 Encounter for screening for other metabolic disorders: Secondary | ICD-10-CM

## 2019-01-13 DIAGNOSIS — Z1211 Encounter for screening for malignant neoplasm of colon: Secondary | ICD-10-CM | POA: Diagnosis not present

## 2019-01-13 NOTE — Progress Notes (Signed)
Antonio Moore 48 y.o.   Chief Complaint  Patient presents with  . Transitions Of Care  . CPE    HISTORY OF PRESENT ILLNESS: This is a 48 y.o. male here for annual exam and transition of care.  First visit with me. No chronic medical problems.  No chronic medications. Healthy male with a healthy lifestyle.  Exercises regularly.  Good nutrition. Health maintenance review: Up to date. No complaints or medical concerns today.  HPI   Prior to Admission medications   Not on File    Allergies  Allergen Reactions  . Bee Venom   . Wasp Venom     There are no active problems to display for this patient.   History reviewed. No pertinent past medical history.  Past Surgical History:  Procedure Laterality Date  . WISDOM TOOTH EXTRACTION      Social History   Socioeconomic History  . Marital status: Married    Spouse name: Not on file  . Number of children: Not on file  . Years of education: Not on file  . Highest education level: Not on file  Occupational History  . Not on file  Social Needs  . Financial resource strain: Not on file  . Food insecurity    Worry: Not on file    Inability: Not on file  . Transportation needs    Medical: Not on file    Non-medical: Not on file  Tobacco Use  . Smoking status: Never Smoker  . Smokeless tobacco: Never Used  Substance and Sexual Activity  . Alcohol use: No  . Drug use: No  . Sexual activity: Not on file  Lifestyle  . Physical activity    Days per week: Not on file    Minutes per session: Not on file  . Stress: Not on file  Relationships  . Social Herbalist on phone: Not on file    Gets together: Not on file    Attends religious service: Not on file    Active member of club or organization: Not on file    Attends meetings of clubs or organizations: Not on file    Relationship status: Not on file  . Intimate partner violence    Fear of current or ex partner: Not on file    Emotionally abused: Not  on file    Physically abused: Not on file    Forced sexual activity: Not on file  Other Topics Concern  . Not on file  Social History Narrative  . Not on file    Family History  Problem Relation Age of Onset  . Stroke Mother   . Stroke Father   . Cancer Brother      Review of Systems  Constitutional: Negative.  Negative for chills and fever.  HENT: Negative.  Negative for congestion and sore throat.   Eyes: Negative.   Respiratory: Negative.  Negative for cough and shortness of breath.   Cardiovascular: Negative.  Negative for chest pain, palpitations and leg swelling.  Gastrointestinal: Negative.  Negative for abdominal pain, blood in stool, diarrhea, melena, nausea and vomiting.  Genitourinary: Negative.  Negative for dysuria and hematuria.  Musculoskeletal: Negative.  Negative for back pain, myalgias and neck pain.  Skin: Negative.  Negative for rash.  Neurological: Negative.  Negative for dizziness and headaches.  Endo/Heme/Allergies: Negative.   All other systems reviewed and are negative.   Vitals:   01/13/19 1458  BP: 116/72  Pulse: (!) 52  Temp: (!) 97.4 F (36.3 C)  SpO2: 99%    Physical Exam Vitals signs reviewed.  Constitutional:      Appearance: Normal appearance.  HENT:     Head: Normocephalic and atraumatic.     Mouth/Throat:     Mouth: Mucous membranes are moist.     Pharynx: Oropharynx is clear.  Eyes:     Extraocular Movements: Extraocular movements intact.     Conjunctiva/sclera: Conjunctivae normal.     Pupils: Pupils are equal, round, and reactive to light.  Neck:     Musculoskeletal: Normal range of motion and neck supple.  Cardiovascular:     Rate and Rhythm: Normal rate and regular rhythm.     Pulses: Normal pulses.     Heart sounds: Normal heart sounds.  Pulmonary:     Effort: Pulmonary effort is normal.     Breath sounds: Normal breath sounds.  Abdominal:     General: Bowel sounds are normal. There is no distension.      Palpations: Abdomen is soft. There is no mass.     Tenderness: There is no abdominal tenderness. There is no guarding.  Musculoskeletal: Normal range of motion.        General: No swelling or tenderness.     Right lower leg: No edema.     Left lower leg: No edema.  Skin:    General: Skin is warm and dry.     Capillary Refill: Capillary refill takes less than 2 seconds.  Neurological:     General: No focal deficit present.     Mental Status: He is alert and oriented to person, place, and time.  Psychiatric:        Mood and Affect: Mood normal.        Behavior: Behavior normal.      ASSESSMENT & PLAN: Antonio Moore was seen today for transitions of care and cpe.  Diagnoses and all orders for this visit:  Routine general medical examination at a health care facility  Colon cancer screening -     Ambulatory referral to Gastroenterology  Screening for deficiency anemia -     CBC with Differential  Screening for lipoid disorders -     Lipid panel  Screening for endocrine, metabolic and immunity disorder -     Comprehensive metabolic panel -     Hemoglobin A1c -     Lipid panel    Patient Instructions       If you have lab work done today you will be contacted with your lab results within the next 2 weeks.  If you have not heard from [place] then please contact us. The fastest way to get your results is to register for My Chart.   IF you received an x-ray today, you will receive an invoice from [facility]. Please contact [facility] at [phone] with questions or concerns regarding your invoice.   IF you received labwork today, you will receive an invoice from [place]. Please contact [facility] at [phone] with questions or concerns regarding your invoice.   Our billing staff will not be able to assist you with questions regarding bills from these companies.  You will be contacted with the lab results as soon as they are available. The fastest way to  get your results is to activate your My Chart account. Instructions are located on the last page of this paperwork. If you have not heard from [place] regarding the results in 2 weeks, please contact this office.  Health Maintenance, Male Adopting a healthy lifestyle and getting preventive care are important in promoting health and wellness. Ask your health care provider about:  The right schedule for you to have regular tests and exams.  Things you can do on your own to prevent diseases and keep yourself healthy. What should I know about diet, weight, and exercise? Eat a healthy diet   Eat a diet that includes plenty of vegetables, fruits, low-fat dairy products, and lean protein.  Do not eat a lot of foods that are high in solid fats, added sugars, or sodium. Maintain a healthy weight Body mass index (BMI) is a measurement that can be used to identify possible weight problems. It estimates body fat based on height and weight. Your health care provider can help determine your BMI and help you achieve or maintain a healthy weight. Get regular exercise Get regular exercise. This is one of the most important things you can do for your health. Most adults should:  Exercise for at least 150 minutes each week. The exercise should increase your heart rate and make you sweat (moderate-intensity exercise).  Do strengthening exercises at least twice a week. This is in addition to the moderate-intensity exercise.  Spend less time sitting. Even light physical activity can be beneficial. Watch cholesterol and blood lipids Have your blood tested for lipids and cholesterol at 49 years of age, then have this test every 5 years. You may need to have your cholesterol levels checked more often if:  Your lipid or cholesterol levels are high.  You are older than 49 years of age.  You are at high risk for heart disease. What should I know about cancer screening? Many types of cancers can be  detected early and may often be prevented. Depending on your health history and family history, you may need to have cancer screening at various ages. This may include screening for:  Colorectal cancer.  Prostate cancer.  Skin cancer.  Lung cancer. What should I know about heart disease, diabetes, and high blood pressure? Blood pressure and heart disease  High blood pressure causes heart disease and increases the risk of stroke. This is more likely to develop in people who have high blood pressure readings, are of African descent, or are overweight.  Talk with your health care provider about your target blood pressure readings.  Have your blood pressure checked: ? Every 3-5 years if you are 80-37 years of age. ? Every year if you are 88 years old or older.  If you are between the ages of 56 and 10 and are a current or former smoker, ask your health care provider if you should have a one-time screening for abdominal aortic aneurysm (AAA). Diabetes Have regular diabetes screenings. This checks your fasting blood sugar level. Have the screening done:  Once every three years after age 40 if you are at a normal weight and have a low risk for diabetes.  More often and at a younger age if you are overweight or have a high risk for diabetes. What should I know about preventing infection? Hepatitis B If you have a higher risk for hepatitis B, you should be screened for this virus. Talk with your health care provider to find out if you are at risk for hepatitis B infection. Hepatitis C Blood testing is recommended for:  Everyone born from [birth year] through 1965.  Anyone with known risk factors for hepatitis C. Sexually transmitted infections (STIs)  You should be screened each year  for STIs, including gonorrhea and chlamydia, if: ? You are sexually active and are younger than 49 years of age. ? You are older than 49 years of age and your health care provider tells you that you are at risk  for this type of infection. ? Your sexual activity has changed since you were last screened, and you are at increased risk for chlamydia or gonorrhea. Ask your health care provider if you are at risk.  Ask your health care provider about whether you are at high risk for HIV. Your health care provider may recommend a prescription medicine to help prevent HIV infection. If you choose to take medicine to prevent HIV, you should first get tested for HIV. You should then be tested every 3 months for as long as you are taking the medicine. Follow these instructions at home: Lifestyle  Do not use any products that contain nicotine or tobacco, such as cigarettes, e-cigarettes, and chewing tobacco. If you need help quitting, ask your health care provider.  Do not use street drugs.  Do not share needles.  Ask your health care provider for help if you need support or information about quitting drugs. Alcohol use  Do not drink alcohol if your health care provider tells you not to drink.  If you drink alcohol: ? Limit how much you have to 0-2 drinks a day. ? Be aware of how much alcohol is in your drink. In the [place] one drink equals one 12 oz bottle of beer (355 mL), one 5 oz glass of wine (148 mL), or one 1 oz glass of hard liquor (44 mL). General instructions  Schedule regular health, dental, and eye exams.  Stay current with your vaccines.  Tell your health care provider if: ? You often feel depressed. ? You have ever been abused or do not feel safe at home. Summary  Adopting a healthy lifestyle and getting preventive care are important in promoting health and wellness.  Follow your health care provider's instructions about healthy diet, exercising, and getting tested or screened for diseases.  Follow your health care provider's instructions on monitoring your cholesterol and blood pressure. This information is not intended to replace advice given to you by your health care provider.  Make sure you discuss any questions you have with your health care provider. Document Released: 11/11/2007 Document Revised: 05/08/2018 Document Reviewed: 05/08/2018 Elsevier Patient Education  2020 Elsevier Inc.      Agustina Caroli, MD [facility]

## 2019-01-13 NOTE — Patient Instructions (Addendum)
   If you have lab work done today you will be contacted with your lab results within the next 2 weeks.  If you have not heard from us then please contact us. The fastest way to get your results is to register for My Chart.   IF you received an x-ray today, you will receive an invoice from [facility]. Please contact [facility] at [phone] with questions or concerns regarding your invoice.   IF you received labwork today, you will receive an invoice from [facility]. Please contact [facility] at [phone] with questions or concerns regarding your invoice.   Our billing staff will not be able to assist you with questions regarding bills from these companies.  You will be contacted with the lab results as soon as they are available. The fastest way to get your results is to activate your My Chart account. Instructions are located on the last page of this paperwork. If you have not heard from us regarding the results in 2 weeks, please contact this office.     Health Maintenance, Male Adopting a healthy lifestyle and getting preventive care are important in promoting health and wellness. Ask your health care provider about:  The right schedule for you to have regular tests and exams.  Things you can do on your own to prevent diseases and keep yourself healthy. What should I know about diet, weight, and exercise? Eat a healthy diet   Eat a diet that includes plenty of vegetables, fruits, low-fat dairy products, and lean protein.  Do not eat a lot of foods that are high in solid fats, added sugars, or sodium. Maintain a healthy weight Body mass index (BMI) is a measurement that can be used to identify possible weight problems. It estimates body fat based on height and weight. Your health care provider can help determine your BMI and help you achieve or maintain a healthy weight. Get regular exercise Get regular exercise. This is one of the most important things you  can do for your health. Most adults should:  Exercise for at least 150 minutes each week. The exercise should increase your heart rate and make you sweat (moderate-intensity exercise).  Do strengthening exercises at least twice a week. This is in addition to the moderate-intensity exercise.  Spend less time sitting. Even light physical activity can be beneficial. Watch cholesterol and blood lipids Have your blood tested for lipids and cholesterol at 49 years of age, then have this test every 5 years. You may need to have your cholesterol levels checked more often if:  Your lipid or cholesterol levels are high.  You are older than 49 years of age.  You are at high risk for heart disease. What should I know about cancer screening? Many types of cancers can be detected early and may often be prevented. Depending on your health history and family history, you may need to have cancer screening at various ages. This may include screening for:  Colorectal cancer.  Prostate cancer.  Skin cancer.  Lung cancer. What should I know about heart disease, diabetes, and high blood pressure? Blood pressure and heart disease  High blood pressure causes heart disease and increases the risk of stroke. This is more likely to develop in people who have high blood pressure readings, are of African descent, or are overweight.  Talk with your health care provider about your target blood pressure readings.  Have your blood pressure checked: ? Every 3-5 years if you are 18-39 years   of age. ? Every year if you are 40 years old or older.  If you are between the ages of 65 and 75 and are a current or former smoker, ask your health care provider if you should have a one-time screening for abdominal aortic aneurysm (AAA). Diabetes Have regular diabetes screenings. This checks your fasting blood sugar level. Have the screening done:  Once every three years after age 45 if you are at a normal weight and have  a low risk for diabetes.  More often and at a younger age if you are overweight or have a high risk for diabetes. What should I know about preventing infection? Hepatitis B If you have a higher risk for hepatitis B, you should be screened for this virus. Talk with your health care provider to find out if you are at risk for hepatitis B infection. Hepatitis C Blood testing is recommended for:  Everyone born from 1945 through 1965.  Anyone with known risk factors for hepatitis C. Sexually transmitted infections (STIs)  You should be screened each year for STIs, including gonorrhea and chlamydia, if: ? You are sexually active and are younger than 49 years of age. ? You are older than 49 years of age and your health care provider tells you that you are at risk for this type of infection. ? Your sexual activity has changed since you were last screened, and you are at increased risk for chlamydia or gonorrhea. Ask your health care provider if you are at risk.  Ask your health care provider about whether you are at high risk for HIV. Your health care provider may recommend a prescription medicine to help prevent HIV infection. If you choose to take medicine to prevent HIV, you should first get tested for HIV. You should then be tested every 3 months for as long as you are taking the medicine. Follow these instructions at home: Lifestyle  Do not use any products that contain nicotine or tobacco, such as cigarettes, e-cigarettes, and chewing tobacco. If you need help quitting, ask your health care provider.  Do not use street drugs.  Do not share needles.  Ask your health care provider for help if you need support or information about quitting drugs. Alcohol use  Do not drink alcohol if your health care provider tells you not to drink.  If you drink alcohol: ? Limit how much you have to 0-2 drinks a day. ? Be aware of how much alcohol is in your drink. In the [place] one drink equals one 12  oz bottle of beer (355 mL), one 5 oz glass of wine (148 mL), or one 1 oz glass of hard liquor (44 mL). General instructions  Schedule regular health, dental, and eye exams.  Stay current with your vaccines.  Tell your health care provider if: ? You often feel depressed. ? You have ever been abused or do not feel safe at home. Summary  Adopting a healthy lifestyle and getting preventive care are important in promoting health and wellness.  Follow your health care provider's instructions about healthy diet, exercising, and getting tested or screened for diseases.  Follow your health care provider's instructions on monitoring your cholesterol and blood pressure. This information is not intended to replace advice given to you by your health care provider. Make sure you discuss any questions you have with your health care provider. Document Released: 11/11/2007 Document Revised: 05/08/2018 Document Reviewed: 05/08/2018 Elsevier Patient Education  2020 Elsevier Inc.  

## 2019-01-14 LAB — COMPREHENSIVE METABOLIC PANEL
ALT: 16 IU/L (ref 0–44)
AST: 23 IU/L (ref 0–40)
Albumin/Globulin Ratio: 2.8 — ABNORMAL HIGH (ref 1.2–2.2)
Albumin: 4.7 g/dL (ref 4.0–5.0)
Alkaline Phosphatase: 76 IU/L (ref 39–117)
BUN/Creatinine Ratio: 9 (ref 9–20)
BUN: 8 mg/dL (ref 6–24)
Bilirubin Total: 0.4 mg/dL (ref 0.0–1.2)
CO2: 26 mmol/L (ref 20–29)
Calcium: 9.3 mg/dL (ref 8.7–10.2)
Chloride: 101 mmol/L (ref 96–106)
Creatinine, Ser: 0.91 mg/dL (ref 0.76–1.27)
GFR calc Af Amer: 114 mL/min/{1.73_m2} (ref >59)
GFR calc non Af Amer: 99 mL/min/{1.73_m2} (ref >59)
Globulin, Total: 1.7 g/dL (ref 1.5–4.5)
Glucose: 81 mg/dL (ref 65–99)
Potassium: 4.3 mmol/L (ref 3.5–5.2)
Sodium: 140 mmol/L (ref 134–144)
Total Protein: 6.4 g/dL (ref 6.0–8.5)

## 2019-01-14 LAB — CBC WITH DIFFERENTIAL/PLATELET
Basophils Absolute: 0 10*3/uL (ref 0.0–0.2)
Basos: 1 %
EOS (ABSOLUTE): 0.1 10*3/uL (ref 0.0–0.4)
Eos: 3 %
Hematocrit: 39.7 % (ref 37.5–51.0)
Hemoglobin: 13.3 g/dL (ref 13.0–17.7)
Immature Grans (Abs): 0 10*3/uL (ref 0.0–0.1)
Immature Granulocytes: 0 %
Lymphocytes Absolute: 1.4 10*3/uL (ref 0.7–3.1)
Lymphs: 38 %
MCH: 29.5 pg (ref 26.6–33.0)
MCHC: 33.5 g/dL (ref 31.5–35.7)
MCV: 88 fL (ref 79–97)
Monocytes Absolute: 0.2 10*3/uL (ref 0.1–0.9)
Monocytes: 6 %
Neutrophils Absolute: 1.9 10*3/uL (ref 1.4–7.0)
Neutrophils: 52 %
Platelets: 207 10*3/uL (ref 150–450)
RBC: 4.51 x10E6/uL (ref 4.14–5.80)
RDW: 12.6 % (ref 11.6–15.4)
WBC: 3.6 10*3/uL (ref 3.4–10.8)

## 2019-01-14 LAB — HEMOGLOBIN A1C
Est. average glucose Bld gHb Est-mCnc: 100 mg/dL
Hgb A1c MFr Bld: 5.1 % (ref 4.8–5.6)

## 2019-01-14 LAB — LIPID PANEL
Chol/HDL Ratio: 2.6 ratio (ref 0.0–5.0)
Cholesterol, Total: 158 mg/dL (ref 100–199)
HDL: 60 mg/dL (ref >39)
LDL Calculated: 91 mg/dL (ref 0–99)
Triglycerides: 33 mg/dL (ref 0–149)
VLDL Cholesterol Cal: 7 mg/dL (ref 5–40)

## 2019-02-17 ENCOUNTER — Encounter: Payer: Self-pay | Admitting: Emergency Medicine

## 2019-05-02 ENCOUNTER — Other Ambulatory Visit: Payer: Self-pay

## 2019-05-02 DIAGNOSIS — Z20822 Contact with and (suspected) exposure to covid-19: Secondary | ICD-10-CM

## 2019-05-04 LAB — NOVEL CORONAVIRUS, NAA: SARS-CoV-2, NAA: NOT DETECTED

## 2019-09-04 ENCOUNTER — Ambulatory Visit: Payer: PRIVATE HEALTH INSURANCE | Attending: Family

## 2019-09-04 DIAGNOSIS — Z23 Encounter for immunization: Secondary | ICD-10-CM

## 2019-09-04 NOTE — Progress Notes (Signed)
   Covid-19 Vaccination Clinic  Name:  Antonio Moore    MRN: [id number] DOB: 25-Nov-1970  09/04/2019  Mr. [name] was observed post Covid-19 immunization for 15 minutes without incident. He was provided with Vaccine Information Sheet and instruction to access the V-Safe system.   Mr. [name] was instructed to call 911 with any severe reactions post vaccine: Marland Kitchen Difficulty breathing  . Swelling of face and throat  . A fast heartbeat  . A bad rash all over body  . Dizziness and weakness   Immunizations Administered    Name Date Dose VIS Date Route   Moderna COVID-19 Vaccine 09/04/2019  2:33 PM 0.5 mL 04/29/2019 Intramuscular   Manufacturer: Moderna   Lot: [id number]   [id number]

## 2019-10-07 ENCOUNTER — Ambulatory Visit: Payer: PRIVATE HEALTH INSURANCE

## 2019-10-21 ENCOUNTER — Ambulatory Visit: Payer: PRIVATE HEALTH INSURANCE | Attending: Family

## 2019-10-21 DIAGNOSIS — Z23 Encounter for immunization: Secondary | ICD-10-CM

## 2019-10-21 NOTE — Progress Notes (Signed)
   Covid-19 Vaccination Clinic  Name:  Antonio Moore    MRN: [id number] DOB: Aug 18, 1970  10/21/2019  Mr. Antonio Moore was observed post Covid-19 immunization for 15 minutes without incident. He was provided with Vaccine Information Sheet and instruction to access the V-Safe system.   [name] was instructed to call 911 with any severe reactions post vaccine: Marland Kitchen Difficulty breathing  . Swelling of face and throat  . A fast heartbeat  . A bad rash all over body  . Dizziness and weakness   Immunizations Administered    Name Date Dose VIS Date Route   Moderna COVID-19 Vaccine 10/21/2019  1:07 PM 0.5 mL 04/2019 Intramuscular   Manufacturer: Moderna   Lot: [id number]   AmesDW:[id number]

## 2019-11-27 ENCOUNTER — Telehealth: Payer: Self-pay | Admitting: Emergency Medicine

## 2019-11-27 NOTE — Telephone Encounter (Signed)
Called patient to reschedule their appointment due to the provider not being in the office on the date that it is scheduled. Tried to reschedule

## 2020-01-16 ENCOUNTER — Encounter: Payer: BC Managed Care – PPO | Admitting: Emergency Medicine

## 2020-01-23 ENCOUNTER — Other Ambulatory Visit: Payer: PRIVATE HEALTH INSURANCE

## 2020-01-23 ENCOUNTER — Other Ambulatory Visit: Payer: Self-pay

## 2020-01-23 DIAGNOSIS — Z20822 Contact with and (suspected) exposure to covid-19: Secondary | ICD-10-CM

## 2020-01-24 LAB — NOVEL CORONAVIRUS, NAA: SARS-CoV-2, NAA: NOT DETECTED

## 2020-01-24 LAB — SARS-COV-2, NAA 2 DAY TAT

## 2020-03-10 ENCOUNTER — Other Ambulatory Visit: Payer: Self-pay

## 2020-03-10 ENCOUNTER — Ambulatory Visit (INDEPENDENT_AMBULATORY_CARE_PROVIDER_SITE_OTHER): Payer: BC Managed Care – PPO | Admitting: Emergency Medicine

## 2020-03-10 ENCOUNTER — Encounter: Payer: Self-pay | Admitting: Emergency Medicine

## 2020-03-10 ENCOUNTER — Encounter: Payer: BC Managed Care – PPO | Admitting: Emergency Medicine

## 2020-03-10 VITALS — BP 117/70 | HR 49 | Temp 97.2°F | Resp 16 | Ht 71.0 in | Wt 195.0 lb

## 2020-03-10 DIAGNOSIS — Z1211 Encounter for screening for malignant neoplasm of colon: Secondary | ICD-10-CM

## 2020-03-10 DIAGNOSIS — Z1159 Encounter for screening for other viral diseases: Secondary | ICD-10-CM | POA: Diagnosis not present

## 2020-03-10 DIAGNOSIS — I493 Ventricular premature depolarization: Secondary | ICD-10-CM | POA: Diagnosis not present

## 2020-03-10 DIAGNOSIS — Z13228 Encounter for screening for other metabolic disorders: Secondary | ICD-10-CM

## 2020-03-10 DIAGNOSIS — Z1329 Encounter for screening for other suspected endocrine disorder: Secondary | ICD-10-CM

## 2020-03-10 DIAGNOSIS — Z0001 Encounter for general adult medical examination with abnormal findings: Secondary | ICD-10-CM | POA: Diagnosis not present

## 2020-03-10 DIAGNOSIS — Z130 Encounter for screening for diseases of the blood and blood-forming organs and certain disorders involving the immune mechanism: Secondary | ICD-10-CM | POA: Diagnosis not present

## 2020-03-10 DIAGNOSIS — Z13220 Encounter for screening for lipoid disorders: Secondary | ICD-10-CM

## 2020-03-10 DIAGNOSIS — R002 Palpitations: Secondary | ICD-10-CM | POA: Diagnosis not present

## 2020-03-10 LAB — COMPREHENSIVE METABOLIC PANEL

## 2020-03-10 LAB — CBC WITH DIFFERENTIAL/PLATELET
Hemoglobin: 13.5 g/dL (ref 13.0–17.7)
MCV: 88 fL (ref 79–97)
Monocytes Absolute: 0.3 10*3/uL (ref 0.1–0.9)
Monocytes: 7 %
Neutrophils: 54 %
RBC: 4.5 x10E6/uL (ref 4.14–5.80)

## 2020-03-10 LAB — LIPID PANEL

## 2020-03-10 LAB — HEPATITIS C ANTIBODY

## 2020-03-10 NOTE — Patient Instructions (Addendum)
   If you have lab work done today you will be contacted with your lab results within the next 2 weeks.  If you have not heard from us then please contact us. The fastest way to get your results is to register for My Chart.   IF you received an x-ray today, you will receive an invoice from [facility]. Please contact [facility] at [phone] with questions or concerns regarding your invoice.   IF you received labwork today, you will receive an invoice from [facility]. Please contact [facility] at [phone] with questions or concerns regarding your invoice.   Our billing staff will not be able to assist you with questions regarding bills from these companies.  You will be contacted with the lab results as soon as they are available. The fastest way to get your results is to activate your My Chart account. Instructions are located on the last page of this paperwork. If you have not heard from us regarding the results in 2 weeks, please contact this office.      Health Maintenance, Male Adopting a healthy lifestyle and getting preventive care are important in promoting health and wellness. Ask your health care provider about:  The right schedule for you to have regular tests and exams.  Things you can do on your own to prevent diseases and keep yourself healthy. What should I know about diet, weight, and exercise? Eat a healthy diet   Eat a diet that includes plenty of vegetables, fruits, low-fat dairy products, and lean protein.  Do not eat a lot of foods that are high in solid fats, added sugars, or sodium. Maintain a healthy weight Body mass index (BMI) is a measurement that can be used to identify possible weight problems. It estimates body fat based on height and weight. Your health care provider can help determine your BMI and help you achieve or maintain a healthy weight. Get regular exercise Get regular exercise. This is one of the most important things you  can do for your health. Most adults should:  Exercise for at least 150 minutes each week. The exercise should increase your heart rate and make you sweat (moderate-intensity exercise).  Do strengthening exercises at least twice a week. This is in addition to the moderate-intensity exercise.  Spend less time sitting. Even light physical activity can be beneficial. Watch cholesterol and blood lipids Have your blood tested for lipids and cholesterol at 50 years of age, then have this test every 5 years. You may need to have your cholesterol levels checked more often if:  Your lipid or cholesterol levels are high.  You are older than 50 years of age.  You are at high risk for heart disease. What should I know about cancer screening? Many types of cancers can be detected early and may often be prevented. Depending on your health history and family history, you may need to have cancer screening at various ages. This may include screening for:  Colorectal cancer.  Prostate cancer.  Skin cancer.  Lung cancer. What should I know about heart disease, diabetes, and high blood pressure? Blood pressure and heart disease  High blood pressure causes heart disease and increases the risk of stroke. This is more likely to develop in people who have high blood pressure readings, are of African descent, or are overweight.  Talk with your health care provider about your target blood pressure readings.  Have your blood pressure checked: ? Every 3-5 years if you are 18-39   years of age. ? Every year if you are 40 years old or older.  If you are between the ages of 65 and 75 and are a current or former smoker, ask your health care provider if you should have a one-time screening for abdominal aortic aneurysm (AAA). Diabetes Have regular diabetes screenings. This checks your fasting blood sugar level. Have the screening done:  Once every three years after age 45 if you are at a normal weight and have  a low risk for diabetes.  More often and at a younger age if you are overweight or have a high risk for diabetes. What should I know about preventing infection? Hepatitis B If you have a higher risk for hepatitis B, you should be screened for this virus. Talk with your health care provider to find out if you are at risk for hepatitis B infection. Hepatitis C Blood testing is recommended for:  Everyone born from 1945 through 1965.  Anyone with known risk factors for hepatitis C. Sexually transmitted infections (STIs)  You should be screened each year for STIs, including gonorrhea and chlamydia, if: ? You are sexually active and are younger than 50 years of age. ? You are older than 50 years of age and your health care provider tells you that you are at risk for this type of infection. ? Your sexual activity has changed since you were last screened, and you are at increased risk for chlamydia or gonorrhea. Ask your health care provider if you are at risk.  Ask your health care provider about whether you are at high risk for HIV. Your health care provider may recommend a prescription medicine to help prevent HIV infection. If you choose to take medicine to prevent HIV, you should first get tested for HIV. You should then be tested every 3 months for as long as you are taking the medicine. Follow these instructions at home: Lifestyle  Do not use any products that contain nicotine or tobacco, such as cigarettes, e-cigarettes, and chewing tobacco. If you need help quitting, ask your health care provider.  Do not use street drugs.  Do not share needles.  Ask your health care provider for help if you need support or information about quitting drugs. Alcohol use  Do not drink alcohol if your health care provider tells you not to drink.  If you drink alcohol: ? Limit how much you have to 0-2 drinks a day. ? Be aware of how much alcohol is in your drink. In the [place] one drink equals one 12  oz bottle of beer (355 mL), one 5 oz glass of wine (148 mL), or one 1 oz glass of hard liquor (44 mL). General instructions  Schedule regular health, dental, and eye exams.  Stay current with your vaccines.  Tell your health care provider if: ? You often feel depressed. ? You have ever been abused or do not feel safe at home. Summary  Adopting a healthy lifestyle and getting preventive care are important in promoting health and wellness.  Follow your health care provider's instructions about healthy diet, exercising, and getting tested or screened for diseases.  Follow your health care provider's instructions on monitoring your cholesterol and blood pressure. This information is not intended to replace advice given to you by your health care provider. Make sure you discuss any questions you have with your health care provider. Document Revised: 05/08/2018 Document Reviewed: 05/08/2018 Elsevier Patient Education  2020 Elsevier Inc.  

## 2020-03-10 NOTE — Progress Notes (Signed)
Antonio Moore 49 y.o.   Chief Complaint  Patient presents with  . Annual Exam    HISTORY OF PRESENT ILLNESS: This is a 49 y.o. male here for his annual exam. Healthy male with a healthy lifestyle. No chronic medical problems.  No chronic medications. Good nutrition. However has been having palpitations for the past couple of months.  Mostly at night when he is lying down.  No associated symptoms. No chest pain on exertion.  Denies difficulty breathing.  Does not take in excessive caffeine.  No history of thyroid condition. Had similar episodes about 20 years ago under different circumstances. Fully vaccinated against Covid. No other complaints or medical concerns.  HPI   Prior to Admission medications   Medication Sig Start Date End Date Taking? Authorizing Provider  Multiple Vitamin (MULTI-VITAMIN PO) Take by mouth.   Yes [provider]    Allergies  Allergen Reactions  . Bee Venom   . Wasp Venom     There are no problems to display for this patient.   History reviewed. No pertinent past medical history.  Past Surgical History:  Procedure Laterality Date  . WISDOM TOOTH EXTRACTION      Social History   Socioeconomic History  . Marital status: Married    Spouse name: Not on file  . Number of children: Not on file  . Years of education: Not on file  . Highest education level: Not on file  Occupational History  . Not on file  Tobacco Use  . Smoking status: Never Smoker  . Smokeless tobacco: Never Used  Substance and Sexual Activity  . Alcohol use: No  . Drug use: No  . Sexual activity: Not on file  Other Topics Concern  . Not on file  Social History Narrative  . Not on file   Social Determinants of Health   Financial Resource Strain:   . Difficulty of Paying Living Expenses: Not on file  Food Insecurity:   . Worried About Charity fundraiser in the Last Year: Not on file  . Ran Out of Food in the Last Year: Not on file  Transportation  Needs:   . Lack of Transportation (Medical): Not on file  . Lack of Transportation (Non-Medical): Not on file  Physical Activity:   . Days of Exercise per Week: Not on file  . Minutes of Exercise per Session: Not on file  Stress:   . Feeling of Stress : Not on file  Social Connections:   . Frequency of Communication with Friends and Family: Not on file  . Frequency of Social Gatherings with Friends and Family: Not on file  . Attends Religious Services: Not on file  . Active Member of Clubs or Organizations: Not on file  . Attends Archivist Meetings: Not on file  . Marital Status: Not on file  Intimate Partner Violence:   . Fear of Current or Ex-Partner: Not on file  . Emotionally Abused: Not on file  . Physically Abused: Not on file  . Sexually Abused: Not on file    Family History  Problem Relation Age of Onset  . Stroke Mother   . Stroke Father   . Cancer Brother      Review of Systems  Constitutional: Negative.  Negative for chills and fever.  HENT: Negative.  Negative for congestion and sore throat.   Respiratory: Negative.  Negative for cough and shortness of breath.   Cardiovascular: Positive for palpitations. Negative for chest pain,  claudication and leg swelling.  Gastrointestinal: Negative.  Negative for abdominal pain, blood in stool, diarrhea, melena, nausea and vomiting.  Genitourinary: Negative.  Negative for dysuria.  Musculoskeletal: Negative.  Negative for myalgias.  Skin: Negative.  Negative for rash.  Neurological: Negative.  Negative for dizziness and headaches.  All other systems reviewed and are negative.   Today's Vitals   03/10/20 0806  BP: 117/70  Pulse: (!) 49  Resp: 16  Temp: (!) 97.2 F (36.2 C)  TempSrc: Temporal  SpO2: 100%  Weight: 195 lb (88.5 kg)  Height: 5\' 11"  (1.803 m)   Body mass index is 27.2 kg/m.  Physical Exam Vitals reviewed.  Constitutional:      Appearance: Normal appearance.  HENT:     Head:  Normocephalic.  Eyes:     Extraocular Movements: Extraocular movements intact.     Conjunctiva/sclera: Conjunctivae normal.     Pupils: Pupils are equal, round, and reactive to light.  Neck:     Vascular: No carotid bruit.  Cardiovascular:     Rate and Rhythm: Normal rate and regular rhythm.     Pulses: Normal pulses.     Heart sounds: Normal heart sounds.  Pulmonary:     Effort: Pulmonary effort is normal.     Breath sounds: Normal breath sounds.  Abdominal:     General: There is no distension.     Palpations: Abdomen is soft. There is no mass.     Tenderness: There is no abdominal tenderness. There is no right CVA tenderness or left CVA tenderness.  Musculoskeletal:        General: Normal range of motion.     Cervical back: Normal range of motion and neck supple. No tenderness.  Lymphadenopathy:     Cervical: No cervical adenopathy.  Skin:    General: Skin is warm and dry.     Capillary Refill: Capillary refill takes less than 2 seconds.  Neurological:     General: No focal deficit present.     Mental Status: He is alert and oriented to person, place, and time.  Psychiatric:        Mood and Affect: Mood normal.        Behavior: Behavior normal.   EKG: Normal sinus rhythm with occasional PVCs.  Compared with EKG done on 12/20/2017.  No acute ischemic changes.   ASSESSMENT & PLAN: Beatrice was seen today for annual exam.  Diagnoses and all orders for this visit:  Encounter for general adult medical examination with abnormal findings  Screening for colon cancer -     Ambulatory referral to Gastroenterology  Palpitations -     EKG 12-Lead -     CBC with Differential -     Comprehensive metabolic panel -     TSH  Need for hepatitis C screening test -     Hepatitis C antibody screen  Screening for deficiency anemia -     CBC with Differential  Screening for lipoid disorders -     Lipid panel  Screening for endocrine, metabolic and immunity disorder -      Comprehensive metabolic panel -     Hemoglobin A1c      Agustina Caroli, MD [facility]

## 2020-03-11 LAB — COMPREHENSIVE METABOLIC PANEL
Albumin/Globulin Ratio: 2 (ref 1.2–2.2)
Albumin: 4.3 g/dL (ref 4.0–5.0)
BUN/Creatinine Ratio: 9 (ref 9–20)
BUN: 9 mg/dL (ref 6–24)
Bilirubin Total: 0.3 mg/dL (ref 0.0–1.2)
CO2: 28 mmol/L (ref 20–29)
Calcium: 9 mg/dL (ref 8.7–10.2)
Chloride: 102 mmol/L (ref 96–106)
Creatinine, Ser: 1.03 mg/dL (ref 0.76–1.27)
GFR calc Af Amer: 97 mL/min/{1.73_m2} (ref >59)
GFR calc non Af Amer: 84 mL/min/{1.73_m2} (ref >59)
Globulin, Total: 2.1 g/dL (ref 1.5–4.5)
Potassium: 4.3 mmol/L (ref 3.5–5.2)
Sodium: 142 mmol/L (ref 134–144)

## 2020-03-11 LAB — CBC WITH DIFFERENTIAL/PLATELET
Basophils Absolute: 0 10*3/uL (ref 0.0–0.2)
Basos: 1 %
EOS (ABSOLUTE): 0.2 10*3/uL (ref 0.0–0.4)
Eos: 6 %
Hematocrit: 39.7 % (ref 37.5–51.0)
Immature Grans (Abs): 0 10*3/uL (ref 0.0–0.1)
Immature Granulocytes: 0 %
Lymphocytes Absolute: 1.3 10*3/uL (ref 0.7–3.1)
Lymphs: 32 %
MCH: 30 pg (ref 26.6–33.0)
MCHC: 34 g/dL (ref 31.5–35.7)
Neutrophils Absolute: 2.2 10*3/uL (ref 1.4–7.0)
Platelets: 193 10*3/uL (ref 150–450)
RDW: 13 % (ref 11.6–15.4)
WBC: 4 10*3/uL (ref 3.4–10.8)

## 2020-03-11 LAB — LIPID PANEL
Chol/HDL Ratio: 2.5 ratio (ref 0.0–5.0)
HDL: 61 mg/dL (ref >39)
LDL Chol Calc (NIH): 76 mg/dL (ref 0–99)
Triglycerides: 62 mg/dL (ref 0–149)
VLDL Cholesterol Cal: 13 mg/dL (ref 5–40)

## 2020-03-11 LAB — TSH: TSH: 3.26 u[IU]/mL (ref 0.450–4.500)

## 2020-03-11 LAB — HEMOGLOBIN A1C
Est. average glucose Bld gHb Est-mCnc: 108 mg/dL
Hgb A1c MFr Bld: 5.4 % (ref 4.8–5.6)

## 2020-03-23 ENCOUNTER — Encounter: Payer: Self-pay | Admitting: Cardiology

## 2020-03-23 ENCOUNTER — Ambulatory Visit (INDEPENDENT_AMBULATORY_CARE_PROVIDER_SITE_OTHER): Payer: BC Managed Care – PPO | Admitting: Cardiology

## 2020-03-23 ENCOUNTER — Other Ambulatory Visit: Payer: Self-pay

## 2020-03-23 VITALS — BP 118/72 | HR 53 | Ht 71.0 in | Wt 196.2 lb

## 2020-03-23 DIAGNOSIS — R002 Palpitations: Secondary | ICD-10-CM | POA: Diagnosis not present

## 2020-03-23 NOTE — Patient Instructions (Addendum)
Medication Instructions:  Your physician recommends that you continue on your current medications as directed. Please refer to the Current Medication list given to you today.  *If you need a refill on your cardiac medications before your next appointment, please call your pharmacy*  Lab Work: None ordered.  If you have labs (blood work) drawn today and your tests are completely normal, you will receive your results only by: Marland Kitchen MyChart Message (if you have MyChart) OR . A paper copy in the mail If you have any lab test that is abnormal or we need to change your treatment, we will call you to review the results.  Testing/Procedures: Your physician has recommended that you wear a heart monitor. Heart monitors are medical devices that record the heart's electrical activity. Doctors most often use these monitors to diagnose arrhythmias. Arrhythmias are problems with the speed or rhythm of the heartbeat. The monitor is a small, portable device. You can wear one while you do your normal daily activities. This is usually used to diagnose what is causing palpitations/syncope (passing out).   Follow-Up: At [facility], you and your health needs are our priority.  As part of our continuing mission to provide you with exceptional heart care, we have created designated Provider Care Teams.  These Care Teams include your primary Cardiologist (physician) and Advanced Practice Providers (APPs -  Physician Assistants and Nurse Practitioners) who all work together to provide you with the care you need, when you need it.  We recommend signing up for the patient portal called "MyChart".  Sign up information is provided on this After Visit Summary.  MyChart is used to connect with patients for Virtual Visits (Telemedicine).  Patients are able to view lab/test results, encounter notes, upcoming appointments, etc.  Non-urgent messages can be sent to your provider as well.   To learn more about what you can do with  MyChart, go to [email or web address].    Your next appointment:   Your physician wants you to follow-up in: follow up based on results of heart monitor.   Other Instructions: ZIO XT- Long Term Monitor Instructions   Your physician has requested you wear your ZIO patch monitor___7___days.   This is a single patch monitor.  [employer] supplies one patch monitor per enrollment.  Additional stickers are not available.   Please do not apply patch if you will be having a Nuclear Stress Test, Echocardiogram, Cardiac CT, MRI, or Chest Xray during the time frame you would be wearing the monitor. The patch cannot be worn during these tests.  You cannot remove and re-apply the ZIO XT patch monitor.   Your ZIO patch monitor will be sent USPS Priority mail from [facility] directly to your home address. The monitor may also be mailed to a PO BOX if home delivery is not available.   It may take 3-5 days to receive your monitor after you have been enrolled.   Once you have received you monitor, please review enclosed instructions.  Your monitor has already been registered assigning a specific monitor serial # to you.   Applying the monitor   Shave hair from upper left chest.   Hold abrader disc by orange tab.  Rub abrader in 40 strokes over left upper chest as indicated in your monitor instructions.   Clean area with 4 enclosed alcohol pads .  Use all pads to assure are is cleaned thoroughly.  Let dry.   Apply patch as indicated in monitor instructions.  Patch will be  place under collarbone on left side of chest with arrow pointing upward.   Rub patch adhesive wings for 2 minutes.Remove white label marked "1".  Remove white label marked "2".  Rub patch adhesive wings for 2 additional minutes.   While looking in a mirror, press and release button in center of patch.  A small green light will flash 3-4 times .  This will be your only indicator the monitor has been turned on.     Do not shower  for the first 24 hours.  You may shower after the first 24 hours.   Press button if you feel a symptom. You will hear a small click.  Record Date, Time and Symptom in the Patient Log Book.   When you are ready to remove patch, follow instructions on last 2 pages of Patient Log Book.  Stick patch monitor onto last page of Patient Log Book.   Place Patient Log Book in [place] box.  Use locking tab on box and tape box closed securely.  The [employer] and [employer] has [employer] on it.  Please place in mailbox as soon as possible.  Your physician should have your test results approximately 7 days after the monitor has been mailed back to [facility].   Call [facility] at [phone] if you have questions regarding your ZIO XT patch monitor.  Call them immediately if you see an orange light blinking on your monitor.   If your monitor falls off in less than 4 days contact our Monitor department at [phone].  If your monitor becomes loose or falls off after 4 days call [employer] at [phone] for suggestions on securing your monitor.

## 2020-03-23 NOTE — Progress Notes (Signed)
Electrophysiology Office Note:    Date:  03/23/2020   ID:  Antonio Moore, DOB Nov 30, 1970, MRN [id number]  PCP:  Horald Pollen, MD  [place] Cardiologist:  No primary care provider on file.  [facility] Electrophysiologist:  None   Referring MD: Horald Pollen, *   Chief Complaint: Palpitations  History of Present Illness:    Antonio Moore is a 49 y.o. male who presents for an evaluation of palpitations at the request of Dr. Mitchel Honour.  There is no significant past medical history.  He tells me that he experiences palpitations sometimes at night when he lays down.  He feels like his heart is "knocking in his chest".  No syncope or presyncope recently.  He describes a single episode of syncope when he was younger and stood up quickly from sleeping to go use the restroom.  He said as he walked to the restroom he felt himself getting lightheaded and knew he was about the passout.  He then woke up next to the toilet.  He has never had another episode.  No significant cardiovascular issues in his family history.  History reviewed. No pertinent past medical history.  Past Surgical History:  Procedure Laterality Date  . WISDOM TOOTH EXTRACTION      Current Medications: Current Meds  Medication Sig  . Multiple Vitamin (MULTI-VITAMIN PO) Take by mouth.     Allergies:   Bee venom and Wasp venom   Social History   Socioeconomic History  . Marital status: Married    Spouse name: Not on file  . Number of children: Not on file  . Years of education: Not on file  . Highest education level: Not on file  Occupational History  . Not on file  Tobacco Use  . Smoking status: Never Smoker  . Smokeless tobacco: Never Used  Substance and Sexual Activity  . Alcohol use: No  . Drug use: No  . Sexual activity: Not on file  Other Topics Concern  . Not on file  Social History Narrative  . Not on file   Social Determinants of Health   Financial Resource Strain:   .  Difficulty of Paying Living Expenses: Not on file  Food Insecurity:   . Worried About Charity fundraiser in the Last Year: Not on file  . Ran Out of Food in the Last Year: Not on file  Transportation Needs:   . Lack of Transportation (Medical): Not on file  . Lack of Transportation (Non-Medical): Not on file  Physical Activity:   . Days of Exercise per Week: Not on file  . Minutes of Exercise per Session: Not on file  Stress:   . Feeling of Stress : Not on file  Social Connections:   . Frequency of Communication with Friends and Family: Not on file  . Frequency of Social Gatherings with Friends and Family: Not on file  . Attends Religious Services: Not on file  . Active Member of Clubs or Organizations: Not on file  . Attends Archivist Meetings: Not on file  . Marital Status: Not on file     Family History: The patient's family history includes Cancer in his brother; Stroke in his father and mother.  ROS:   Please see the history of present illness.    All other systems reviewed and are negative.  EKGs/Labs/Other Studies Reviewed:    The following studies were reviewed today: October 13 EKG  October 13 EKG shows sinus rhythm  with a single PVC  EKG:  The ekg ordered today demonstrates sinus rhythm with an early repolarization pattern. Recent Labs: 03/10/2020: ALT 12; BUN 9; Creatinine, Ser 1.03; Hemoglobin 13.5; Platelets 193; Potassium 4.3; Sodium 142; TSH 3.260  Recent Lipid Panel    Component Value Date/Time   CHOL 150 03/10/2020 0837   TRIG 62 03/10/2020 0837   HDL 61 03/10/2020 0837   CHOLHDL 2.5 03/10/2020 0837   CHOLHDL 2.8 10/04/2011 1436   VLDL 10 10/04/2011 1436   LDLCALC 76 03/10/2020 0837    Physical Exam:    VS:  BP 118/72   Pulse (!) 53   Ht 5\' 11"  (1.803 m)   Wt 196 lb 3.2 oz (89 kg)   SpO2 99%   BMI 27.36 kg/m     Wt Readings from Last 3 Encounters:  03/23/20 196 lb 3.2 oz (89 kg)  03/10/20 195 lb (88.5 kg)  01/13/19 172 lb 6.4  oz (78.2 kg)     GEN:  Well nourished, well developed in no acute distress HEENT: Normal NECK: No JVD; No carotid bruits LYMPHATICS: No lymphadenopathy CARDIAC: RRR, no murmurs, rubs, gallops RESPIRATORY:  Clear to auscultation without rales, wheezing or rhonchi  ABDOMEN: Soft, non-tender, non-distended MUSCULOSKELETAL:  No edema; No deformity  SKIN: Warm and dry NEUROLOGIC:  Alert and oriented x 3 PSYCHIATRIC:  Normal affect   ASSESSMENT:    1. Palpitations    PLAN:    In order of problems listed above:  1. Palpitations He describes a "knocking in his chest" that is most consistent with PVCs.  His previous EKG this month showed a single PVC.  My suspicion is that when he lays down in bed at night he is more in tune to these extra heartbeats.  We will order a 7-day monitor to assess for the frequency and for any other arrhythmias that may be responsible for his symptoms.  Medical therapy will be limited given his low resting heart rate.  Follow-up based on monitor results  Medication Adjustments/Labs and Tests Ordered: Current medicines are reviewed at length with the patient today.  Concerns regarding medicines are outlined above.  Orders Placed This Encounter  Procedures  . EKG 12-Lead   No orders of the defined types were placed in this encounter.    Signed, Lars Mage, MD, [facility]  03/23/2020 10:43 AM    Electrophysiology [facility]

## 2020-05-14 ENCOUNTER — Ambulatory Visit (INDEPENDENT_AMBULATORY_CARE_PROVIDER_SITE_OTHER): Payer: BC Managed Care – PPO

## 2020-05-14 DIAGNOSIS — R002 Palpitations: Secondary | ICD-10-CM | POA: Diagnosis not present

## 2020-05-25 ENCOUNTER — Other Ambulatory Visit: Payer: Self-pay

## 2020-05-25 ENCOUNTER — Ambulatory Visit (AMBULATORY_SURGERY_CENTER): Payer: Self-pay | Admitting: *Deleted

## 2020-05-25 VITALS — Ht 71.0 in | Wt 203.0 lb

## 2020-05-25 DIAGNOSIS — Z1211 Encounter for screening for malignant neoplasm of colon: Secondary | ICD-10-CM

## 2020-05-25 MED ORDER — SUTAB 1479-225-188 MG PO TABS: 24.0000 | ORAL_TABLET | ORAL | 0 refills | Status: DC

## 2020-05-25 NOTE — Progress Notes (Addendum)
No egg or soy allergy known to patient  No issues with past sedation with any surgeries or procedures No intubation problems in the past  No FH of Malignant Hyperthermia No diet pills per patient No home 02 use per patient  No blood thinners per patient  Pt denies issues with constipation  No A fib or A flutter  EMMI video to pt or via MyChart  COVID 19 guidelines implemented in PV today with Pt and RN  Pt is fully vaccinated  for [employer] given to pt in PV today , Code to Pharmacy   Due to the COVID-19 pandemic we are asking patients to follow certain guidelines.  Pt aware of COVID protocols and [facility] guidelines    addendum- 06-07-20  Heart monitor results are normal - per MD- looks great - no arrhythmias 06-07-2020 [facility] [place]

## 2020-06-07 ENCOUNTER — Encounter: Payer: Self-pay | Admitting: Gastroenterology

## 2020-06-14 ENCOUNTER — Encounter: Payer: BC Managed Care – PPO | Admitting: Gastroenterology

## 2020-06-17 ENCOUNTER — Encounter: Payer: BC Managed Care – PPO | Admitting: Internal Medicine

## 2020-06-22 ENCOUNTER — Ambulatory Visit: Payer: Self-pay | Attending: Internal Medicine

## 2020-06-22 ENCOUNTER — Other Ambulatory Visit: Payer: Self-pay

## 2020-06-22 DIAGNOSIS — Z23 Encounter for immunization: Secondary | ICD-10-CM

## 2020-06-22 NOTE — Progress Notes (Signed)
   Covid-19 Vaccination Clinic  Name:  Antonio Moore    MRN: [id number] DOB: 04-May-1971  06/22/2020  Antonio Moore was observed post Covid-19 immunization for 15 minutes without incident. He was provided with Vaccine Information Sheet and instruction to access the V-Safe system.   Antonio Moore was instructed to call 911 with any severe reactions post vaccine: Marland Kitchen Difficulty breathing  . Swelling of face and throat  . A fast heartbeat  . A bad rash all over body  . Dizziness and weakness     Covid-19 Vaccination Clinic  Name:  Antonio Moore    MRN: [id number] DOB: 10/25/1970  06/22/2020  Antonio Moore was observed post Covid-19 immunization for 15 minutes without incident. He was provided with Vaccine Information Sheet and instruction to access the V-Safe system.   Antonio Moore was instructed to call 911 with any severe reactions post vaccine: Marland Kitchen Difficulty breathing  . Swelling of face and throat  . A fast heartbeat  . A bad rash all over body  . Dizziness and weakness

## 2020-07-05 ENCOUNTER — Other Ambulatory Visit: Payer: Self-pay

## 2020-07-05 ENCOUNTER — Ambulatory Visit (AMBULATORY_SURGERY_CENTER): Payer: BC Managed Care – PPO | Admitting: Internal Medicine

## 2020-07-05 ENCOUNTER — Encounter: Payer: Self-pay | Admitting: Internal Medicine

## 2020-07-05 VITALS — BP 108/64 | HR 51 | Temp 97.7°F | Resp 20 | Ht 71.0 in | Wt 203.0 lb

## 2020-07-05 DIAGNOSIS — D123 Benign neoplasm of transverse colon: Secondary | ICD-10-CM | POA: Diagnosis not present

## 2020-07-05 DIAGNOSIS — Z1211 Encounter for screening for malignant neoplasm of colon: Secondary | ICD-10-CM

## 2020-07-05 MED ORDER — SODIUM CHLORIDE 0.9 % IV SOLN: 500.0000 mL | Freq: Once | INTRAVENOUS | Status: DC

## 2020-07-05 NOTE — Progress Notes (Signed)
Pt's states no medical or surgical changes since previsit or office visit. 

## 2020-07-05 NOTE — Progress Notes (Signed)
Called to room to assist during endoscopic procedure.  Patient ID and intended procedure confirmed with present staff. Received instructions for my participation in the procedure from the performing physician.  

## 2020-07-05 NOTE — Op Note (Signed)
[facility] Patient Name: Antonio Moore Procedure Date: 07/05/2020 3:43 PM MRN: [id number] Endoscopist: Beverley Fiedler , MD Age: 50 Referring MD:  Date of Birth: 1970/07/20 Gender: Male Account #: [id number] Procedure:                Colonoscopy Indications:              Screening for colorectal malignant neoplasm, This                            is the patient's first colonoscopy Medicines:                Monitored Anesthesia Care Procedure:                Pre-Anesthesia Assessment:                           - Prior to the procedure, a History and Physical                            was performed, and patient medications and                            allergies were reviewed. The patient's tolerance of                            previous anesthesia was also reviewed. The risks                            and benefits of the procedure and the sedation                            options and risks were discussed with the patient.                            All questions were answered, and informed consent                            was obtained. Prior Anticoagulants: The patient has                            taken no previous anticoagulant or antiplatelet                            agents. ASA Grade Assessment: I - A normal, healthy                            patient. After reviewing the risks and benefits,                            the patient was deemed in satisfactory condition to                            undergo the procedure.  After obtaining informed consent, the colonoscope                            was passed under direct vision. Throughout the                            procedure, the patient's blood pressure, pulse, and                            oxygen saturations were monitored continuously. The                            Olympus CF-HQ190L (Serial# [id number]) Colonoscope was                            introduced through the anus and advanced to  the                            cecum, identified by appendiceal orifice and                            ileocecal valve. The colonoscopy was performed                            without difficulty. The patient tolerated the                            procedure well. The quality of the bowel                            preparation was good. The ileocecal valve,                            appendiceal orifice, and rectum were photographed. Scope In: 3:51:43 PM Scope Out: 4:14:57 PM Scope Withdrawal Time: 0 hours 18 minutes 31 seconds  Total Procedure Duration: 0 hours 23 minutes 14 seconds  Findings:                 The digital rectal exam was normal.                           A 4 mm polyp was found in the transverse colon. The                            polyp was sessile. The polyp was removed with a                            cold snare. Resection and retrieval were complete.                           The exam was otherwise without abnormality on                            direct and retroflexion views with exception of  hypertrophied anal papillae. Complications:            No immediate complications. Estimated Blood Loss:     Estimated blood loss: none. Impression:               - One 4 mm polyp in the transverse colon, removed                            with a cold snare. Resected and retrieved.                           - The examination was otherwise normal on direct                            and retroflexion views. Recommendation:           - Patient has a contact number available for                            emergencies. The signs and symptoms of potential                            delayed complications were discussed with the                            patient. Return to normal activities tomorrow.                            Written discharge instructions were provided to the                            patient.                           - Resume  previous diet.                           - Continue present medications.                           - Await pathology results.                           - Repeat colonoscopy is recommended. The                            colonoscopy date will be determined after pathology                            results from today's exam become available for                            review. Jerene Bears, MD 07/05/2020 4:19:33 PM This report has been signed electronically.

## 2020-07-05 NOTE — Patient Instructions (Signed)
YOU HAD AN ENDOSCOPIC PROCEDURE TODAY AT THE [facility]:   Refer to the procedure report that was given to you for any specific questions about what was found during the examination.  If the procedure report does not answer your questions, please call your gastroenterologist to clarify.  If you requested that your care partner not be given the details of your procedure findings, then the procedure report has been included in a sealed envelope for you to review at your convenience later.  YOU SHOULD EXPECT: Some feelings of bloating in the abdomen. Passage of more gas than usual.  Walking can help get rid of the air that was put into your GI tract during the procedure and reduce the bloating. If you had a lower endoscopy (such as a colonoscopy or flexible sigmoidoscopy) you may notice spotting of blood in your stool or on the toilet paper. If you underwent a bowel prep for your procedure, you may not have a normal bowel movement for a few days.  Please Note:  You might notice some irritation and congestion in your nose or some drainage.  This is from the oxygen used during your procedure.  There is no need for concern and it should clear up in a day or so.  SYMPTOMS TO REPORT IMMEDIATELY:   Following lower endoscopy (colonoscopy or flexible sigmoidoscopy):  Excessive amounts of blood in the stool  Significant tenderness or worsening of abdominal pains  Swelling of the abdomen that is new, acute  Fever of 100F or higher  For urgent or emergent issues, a gastroenterologist can be reached at any hour by calling [phone]. Do not use MyChart messaging for urgent concerns.    DIET:  We do recommend a small meal at first, but then you may proceed to your regular diet.  Drink plenty of fluids but you should avoid alcoholic beverages for 24 hours.  ACTIVITY:  You should plan to take it easy for the rest of today and you should NOT DRIVE or use heavy machinery until tomorrow (because  of the sedation medicines used during the test).    FOLLOW UP: Our staff will call the number listed on your records 48-72 hours following your procedure to check on you and address any questions or concerns that you may have regarding the information given to you following your procedure. If we do not reach you, we will leave a message.  We will attempt to reach you two times.  During this call, we will ask if you have developed any symptoms of COVID 19. If you develop any symptoms (ie: fever, flu-like symptoms, shortness of breath, cough etc.) before then, please call [phone].  If you test positive for Covid 19 in the 2 weeks post procedure, please call and report this information to us.    If any biopsies were taken you will be contacted by phone or by letter within the next 1-3 weeks.  Please call us at [phone] if you have not heard about the biopsies in 3 weeks.    SIGNATURES/CONFIDENTIALITY: You and/or your care partner have signed paperwork which will be entered into your electronic medical record.  These signatures attest to the fact that that the information above on your After Visit Summary has been reviewed and is understood.  Full responsibility of the confidentiality of this discharge information lies with you and/or your care-partner. 

## 2020-07-05 NOTE — Progress Notes (Signed)
VS by CW. ?

## 2020-07-05 NOTE — Progress Notes (Signed)
A/ox3, pleased with MAC, report to RN 

## 2020-07-07 ENCOUNTER — Telehealth: Payer: Self-pay

## 2020-07-07 ENCOUNTER — Telehealth: Payer: Self-pay | Admitting: *Deleted

## 2020-07-07 NOTE — Telephone Encounter (Signed)
Left message on answering machine. 

## 2020-07-07 NOTE — Telephone Encounter (Signed)
  Follow up Call-  Call back number 07/05/2020  Post procedure Call Back phone  # [phone]  Permission to leave phone message Yes  Some recent data might be hidden     Patient questions:  Do you have a fever, pain , or abdominal swelling? No. Pain Score  0 *  Have you tolerated food without any problems? Yes.    Have you been able to return to your normal activities? Yes.    Do you have any questions about your discharge instructions: Diet   No. Medications  No. Follow up visit  No.  Do you have questions or concerns about your Care? No.  Actions: * If pain score is 4 or above: No action needed, pain <4.  1. Have you developed a fever since your procedure? no  2.   Have you had an respiratory symptoms (SOB or cough) since your procedure? no  3.   Have you tested positive for COVID 19 since your procedure no  4.   Have you had any family members/close contacts diagnosed with the COVID 19 since your procedure?  no   If yes to any of these questions please route to Joylene John, RN and Joella Prince, RN

## 2020-07-14 ENCOUNTER — Encounter: Payer: Self-pay | Admitting: Internal Medicine

## 2020-10-19 NOTE — Patient Instructions (Addendum)
   Medications changes include :   none  You were tested for covid.   Your results will come onto mychart and we will call you with the results.    Stay quarantined at home until there results come back.     Treat your symptoms with over the counter medications - tylenol, advil and cold medication as needed.  Increase your rest and fluids.   Call if your symptoms worsen or if you have any questions.

## 2020-10-19 NOTE — Progress Notes (Signed)
Subjective:    Patient ID: Chipper Herb, male    DOB: August 22, 1970, 50 y.o.   MRN: [id number]  HPI He is here for an acute visit for cold symptoms.   His initial symptoms started 2 weekends ago and felt better, but symptoms restarted three days.   He is experiencing chills, sweats, mild nasal congestion, sinus pain, ST, dry cough that just started, epigastric abdominal pain that has improved, body aches and headaches.   He has not tried taking anything.         Medications and allergies reviewed with patient and updated if appropriate.  Patient Active Problem List   Diagnosis Date Noted  . 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 Multiple Vitamin (MULTI-VITAMIN PO) Take by mouth.    . OMEGA-3 FATTY ACIDS PO Take by mouth.     No current facility-administered medications on file prior to visit.    Past Medical History:  Diagnosis Date  . Anemia    late teens , early 20's     Past Surgical History:  Procedure Laterality Date  . WISDOM TOOTH EXTRACTION      Social History   Socioeconomic History  . Marital status: Unknown    Spouse name: Not on file  . Number of children: Not on file  . Years of education: Not on file  . Highest education level: Not on file  Occupational History  . Not on file  Tobacco Use  . Smoking status: Never Smoker  . Smokeless tobacco: Never Used  Vaping Use  . Vaping Use: Never used  Substance and Sexual Activity  . Alcohol use: No  . Drug use: No  . Sexual activity: Not on file  Other Topics Concern  . Not on file  Social History Narrative   ** Merged History Encounter **       Social Determinants of Health   Financial Resource Strain: Not on file  Food Insecurity: Not on file  Transportation Needs: Not on file  Physical Activity: Not on file  Stress: Not on file  Social Connections: Not on file     Family History  Problem Relation Age of Onset  . Stroke Mother   . Stroke Father   . Stroke Sister   . Cancer Brother   . Pancreatic cancer Brother   . Colon cancer Neg Hx   . Colon polyps Neg Hx   . Esophageal cancer Neg Hx   . Rectal cancer Neg Hx   . Stomach cancer Neg Hx     Review of Systems  Constitutional: Positive for chills and diaphoresis. Negative for appetite change and fever.  HENT: Positive for congestion (mild), sinus pain and sore throat.   Respiratory: Positive for cough (started yesterday). Negative for chest tightness, shortness of breath and wheezing.   Gastrointestinal: Positive for abdominal pain (epigastric - improved). Negative for diarrhea and nausea.  Musculoskeletal: Positive for myalgias.  Neurological: Positive for headaches. Negative for dizziness and light-headedness.       Objective:   Vitals:   10/20/20 0827  BP: 118/74  Pulse: 73  Temp: 98.8 F (37.1 C)  SpO2: 98%   BP Readings from Last 3 Encounters:  10/20/20 118/74  07/05/20 108/64  03/23/20 118/72   Wt Readings from Last 3 Encounters:  10/20/20 198 lb (89.8 kg)  07/05/20 203 lb (92.1 kg)  05/25/20 203 lb (92.1 kg)  Body mass index is 27.62 kg/m.   Physical Exam    GENERAL APPEARANCE: Appears stated age, well appearing, NAD EYES: conjunctiva clear, no icterus HENT: bilateral tympanic membranes and ear canals normal, trachea midline, no cervical or supraclavicular lymphadenopathy LUNGS: Unlabored breathing, good air entry bilaterally, clear to auscultation without wheeze or crackles CARDIOVASCULAR: Normal S1,S2 , no edema ABD: soft, NT, ND SKIN: Warm, dry      Assessment & Plan:    Viral URI Acute Symptoms c/w viral illness, covid is possible Will swab for covid today No treatment at this time since likely viral and symptoms are mild otc meds for symptom relief Advised to stay home from work until covid results returns Rest, fluids, quarantine Call if  symptoms worse, or with questions   This visit occurred during the SARS-CoV-2 public health emergency.  Safety protocols were in place, including screening questions prior to the visit, additional usage of staff PPE, and extensive cleaning of exam room while observing appropriate contact time as indicated for disinfecting solutions.

## 2020-10-20 ENCOUNTER — Other Ambulatory Visit: Payer: Self-pay

## 2020-10-20 ENCOUNTER — Encounter: Payer: Self-pay | Admitting: Internal Medicine

## 2020-10-20 ENCOUNTER — Ambulatory Visit (INDEPENDENT_AMBULATORY_CARE_PROVIDER_SITE_OTHER): Payer: BC Managed Care – PPO | Admitting: Internal Medicine

## 2020-10-20 VITALS — BP 118/74 | HR 73 | Temp 98.8°F | Ht 71.0 in | Wt 198.0 lb

## 2020-10-20 DIAGNOSIS — J069 Acute upper respiratory infection, unspecified: Secondary | ICD-10-CM | POA: Diagnosis not present

## 2020-10-22 ENCOUNTER — Encounter: Payer: Self-pay | Admitting: Internal Medicine

## 2020-10-22 NOTE — Telephone Encounter (Signed)
[facility] [employer], they are faxing results. Covid Detected

## 2020-11-15 ENCOUNTER — Other Ambulatory Visit: Payer: Self-pay

## 2020-11-15 ENCOUNTER — Encounter (HOSPITAL_COMMUNITY): Payer: Self-pay | Admitting: *Deleted

## 2020-11-15 ENCOUNTER — Emergency Department (HOSPITAL_COMMUNITY): Payer: BC Managed Care – PPO

## 2020-11-15 ENCOUNTER — Telehealth: Payer: Self-pay

## 2020-11-15 ENCOUNTER — Emergency Department (HOSPITAL_COMMUNITY)
Admission: EM | Admit: 2020-11-15 | Discharge: 2020-11-15 | Disposition: A | Discharge status: Home / Self Care | Payer: BC Managed Care – PPO | Attending: Emergency Medicine | Admitting: Emergency Medicine

## 2020-11-15 DIAGNOSIS — S80212A Abrasion, left knee, initial encounter: Secondary | ICD-10-CM | POA: Diagnosis not present

## 2020-11-15 DIAGNOSIS — W010XXA Fall on same level from slipping, tripping and stumbling without subsequent striking against object, initial encounter: Secondary | ICD-10-CM | POA: Insufficient documentation

## 2020-11-15 DIAGNOSIS — M7989 Other specified soft tissue disorders: Secondary | ICD-10-CM | POA: Insufficient documentation

## 2020-11-15 DIAGNOSIS — M25472 Effusion, left ankle: Secondary | ICD-10-CM

## 2020-11-15 DIAGNOSIS — S99912A Unspecified injury of left ankle, initial encounter: Secondary | ICD-10-CM | POA: Diagnosis present

## 2020-11-15 DIAGNOSIS — X501XXA Overexertion from prolonged static or awkward postures, initial encounter: Secondary | ICD-10-CM | POA: Insufficient documentation

## 2020-11-15 DIAGNOSIS — M25562 Pain in left knee: Secondary | ICD-10-CM

## 2020-11-15 DIAGNOSIS — S52502A Unspecified fracture of the lower end of left radius, initial encounter for closed fracture: Secondary | ICD-10-CM | POA: Diagnosis not present

## 2020-11-15 DIAGNOSIS — W19XXXA Unspecified fall, initial encounter: Secondary | ICD-10-CM

## 2020-11-15 MED ORDER — IBUPROFEN 400 MG PO TABS
400.0000 mg | ORAL_TABLET | Freq: Once | ORAL | Status: AC | PRN
  Administered 2020-11-15: 08:00:00 400 mg via ORAL
  Filled 2020-11-15: qty 1

## 2020-11-15 MED ORDER — OXYCODONE HCL 5 MG PO TABS: 5.0000 mg | ORAL_TABLET | ORAL | 0 refills | Status: AC | PRN

## 2020-11-15 MED ORDER — OXYCODONE-ACETAMINOPHEN 5-325 MG PO TABS
1.0000 | ORAL_TABLET | Freq: Once | ORAL | Status: AC
  Administered 2020-11-15: 1 via ORAL
  Filled 2020-11-15: qty 1

## 2020-11-15 NOTE — ED Provider Notes (Signed)
[facility] EMERGENCY DEPARTMENT Provider Note   CSN: [id number] Arrival date & time: 11/15/20  0700     History Chief Complaint  Patient presents with   Antonio Moore is a 50 y.o. male with past medical history significant for PVC who presents for evalution of mechanical fall. Tripped and fell this AM leaving to get to car. Looking at keys and fell going down steps. Twisted left ankle, left knee.  Feels like he has some clicking when he ambulates to his knee.  Unknown last tetanus.  He has been ambulatory since the fall.  Was given ibuprofen which helped with his pain.  He is noted some swelling to his left wrist.  No weakness, numbness or tingling.  No preceding symptoms prior to fall.  No LOC, anticoagulation, head injury.  He has not followed with orthopedics.  Rates pain an 8/10.  Was given ibuprofen by triage.  No syncope, chest pain, shortness of breath, sudden onset headache, abdominal pain.  Denies additional aggravating or alleviating factors  History obtained from patient and past medical records.  No interpreter used  Tetanus in Epic 2019   Ortho outpatient >>> None  HPI     Past Medical History:  Diagnosis Date   Anemia    late teens , early 20's     Patient Active Problem List   Diagnosis Date Noted   Premature ventricular contractions (PVCs) (VPCs) 03/10/2020    Past Surgical History:  Procedure Laterality Date   WISDOM TOOTH EXTRACTION         Family History  Problem Relation Age of Onset   Stroke Mother    Stroke Father    Stroke Sister    Cancer Brother    Pancreatic cancer Brother    Colon cancer Neg Hx    Colon polyps Neg Hx    Esophageal cancer Neg Hx    Rectal cancer Neg Hx    Stomach cancer Neg Hx     Social History   Tobacco Use   Smoking status: Never   Smokeless tobacco: Never  Vaping Use   Vaping Use: Never used  Substance Use Topics   Alcohol use: No   Drug use: No    Home  Medications Prior to Admission medications   Medication Sig Start Date End Date Taking? Authorizing Provider  oxyCODONE (ROXICODONE) 5 MG immediate release tablet Take 1 tablet (5 mg total) by mouth every 4 (four) hours as needed for up to 3 days for severe pain. 11/15/20 11/18/20 Yes Flonnie Wierman A, PA-C  Multiple Vitamin (MULTI-VITAMIN PO) Take by mouth.    [provider]  OMEGA-3 FATTY ACIDS PO Take by mouth.    [provider]    Allergies    Bee venom and Wasp venom  Review of Systems   Review of Systems  Constitutional: Negative.   HENT: Negative.    Respiratory: Negative.    Cardiovascular: Negative.   Gastrointestinal: Negative.   Genitourinary: Negative.   Skin:        Left ankle, left wrist, left knee pain  Neurological: Negative.   All other systems reviewed and are negative.  Physical Exam Updated Vital Signs BP 105/70   Pulse 64   Temp 98.4 F (36.9 C) (Oral)   Resp 16   SpO2 100%   Physical Exam Vitals and nursing note reviewed.  Constitutional:      General: He is not in acute distress.    Appearance:  He is well-developed. He is not ill-appearing, toxic-appearing or diaphoretic.  HENT:     Head: Normocephalic and atraumatic.     Comments: No raccoon eyes, battle sign.  No facial tenderness or crepitus or step-off    Ears:     Comments: No hemotympanum    Nose: Nose normal.     Comments: No septal hematoma no overlying nasal bridge tenderness Eyes:     Pupils: Pupils are equal, round, and reactive to light.  Neck:     Comments: No midline spinal tenderness.  Full range of motion without difficulty Cardiovascular:     Rate and Rhythm: Normal rate and regular rhythm.     Pulses: Normal pulses.          Radial pulses are 2+ on the right side and 2+ on the left side.       Dorsalis pedis pulses are 2+ on the right side and 2+ on the left side.     Heart sounds: Normal heart sounds.  Pulmonary:     Effort: Pulmonary effort is  normal. No respiratory distress.     Breath sounds: Normal breath sounds and air entry.     Comments: Clear bilaterally.  Speaks in full sentences without difficulty Chest:     Comments: Equal rise and fall to chest wall.  No tenderness to anterior posterior chest Abdominal:     General: Bowel sounds are normal. There is no distension.     Palpations: Abdomen is soft.     Tenderness: There is no abdominal tenderness. There is no guarding or rebound.     Comments: Soft, nontender  Musculoskeletal:        General: Normal range of motion.     Cervical back: Normal range of motion and neck supple.     Comments: No midline spinal tenderness, step-off.  Nontender bilateral clavicles, scapula.  Nontender bilateral humerus, olecranon.  Full range of motion right upper extremity.  Tenderness, overlying swelling to left distal radius.  Wiggles fingers without difficulty.  Pelvis stable, nontender palpation.  Mild tenderness to left knee.  Abrasion over tibial plateau.  Able to flex, extend, straight leg raise bilaterally without difficulty.  Able to plantarflex and dorsiflex bilateral ankles.  Wiggles toes without difficulty  Skin:    General: Skin is warm and dry.     Capillary Refill: Capillary refill takes less than 2 seconds.     Comments: Nickel sized abrasion to left anterior knee over tibial plateau.  No lacerations.  Neurological:     General: No focal deficit present.     Mental Status: He is alert and oriented to person, place, and time.    ED Results / Procedures / Treatments   Labs (all labs ordered are listed, but only abnormal results are displayed) Labs Reviewed - No data to display  EKG None  Radiology DG Wrist Complete Left  Result Date: 11/15/2020 CLINICAL DATA:  50 year old male status post fall when missed step. Pain. EXAM: LEFT WRIST - COMPLETE 3+ VIEW COMPARISON:  None. FINDINGS: Distal left ulna intact. Comminuted distal left radius fracture with radiocarpal joint  involvement. Mild impaction, minimal fracture fragment displacement. Carpal bone alignment and joint spaces preserved. Metacarpals intact. Soft tissue swelling at the wrist. IMPRESSION: Comminuted but only minimally to mildly impacted distal left radius fracture with radiocarpal joint involvement. Electronically Signed   By: Genevie Ann M.D.   On: 11/15/2020 08:38   DG Ankle Complete Left  Result Date: 11/15/2020 CLINICAL DATA:  50 year old male status post fall when missed step. Pain. EXAM: LEFT ANKLE COMPLETE - 3+ VIEW COMPARISON:  None. FINDINGS: Evidence of ankle joint effusion on the lateral. Intact calcaneus. Preserved mortise joint alignment. Talar dome intact. No distal tibia or fibula fracture. Other visible bones of the left foot appear intact. IMPRESSION: Evidence of joint effusion. No acute fracture or dislocation identified about the left ankle. Electronically Signed   By: Genevie Ann M.D.   On: 11/15/2020 08:39   DG Knee Complete 4 Views Left  Result Date: 11/15/2020 CLINICAL DATA:  Acute LEFT knee pain following fall today. Initial encounter. EXAM: LEFT KNEE - COMPLETE 4+ VIEW COMPARISON:  None. FINDINGS: No evidence of fracture, dislocation, or joint effusion. No evidence of arthropathy or other focal bone abnormality. Soft tissues are unremarkable. IMPRESSION: Negative. Electronically Signed   By: Margarette Canada M.D.   On: 11/15/2020 10:08    Procedures .Splint Application  Date/Time: 11/15/2020 12:06 PM Performed by: Shelby Dubin A, PA-C Authorized by: Nettie Elm, PA-C   Consent:    Consent obtained:  Verbal   Consent given by:  Patient   Risks, benefits, and alternatives were discussed: yes     Risks discussed:  Discoloration, numbness, swelling and pain   Alternatives discussed:  No treatment, delayed treatment, alternative treatment, observation and referral Universal protocol:    Procedure explained and questions answered to patient or proxy's satisfaction: yes      Relevant documents present and verified: yes     Test results available: yes     Imaging studies available: yes     Required blood products, implants, devices, and special equipment available: yes     Site/side marked: yes     Immediately prior to procedure a time out was called: yes     Patient identity confirmed:  Verbally with patient Pre-procedure details:    Distal neurologic exam:  Normal   Distal perfusion: distal pulses strong and brisk capillary refill   Procedure details:    Location:  Wrist   Wrist location:  L wrist   Strapping: no     Cast type:  Short arm   Splint type:  Sugar tong   Supplies:  Elastic bandage, fiberglass, sling and cotton padding   Attestation: Splint applied and adjusted personally by me   Post-procedure details:    Distal neurologic exam:  Normal   Distal perfusion: distal pulses strong and brisk capillary refill     Procedure completion:  Tolerated well, no immediate complications   Post-procedure imaging: not applicable   .Ortho Injury Treatment  Date/Time: 11/15/2020 12:07 PM Performed by: Shelby Dubin A, PA-C Authorized by: Nettie Elm, PA-C   Consent:    Consent obtained:  Verbal   Consent given by:  Patient   Risks discussed:  Fracture, nerve damage, restricted joint movement, vascular damage, stiffness, recurrent dislocation and irreducible dislocation   Alternatives discussed:  No treatment, alternative treatment, immobilization, referral and delayed treatmentInjury location: knee Location details: left knee Injury type: soft tissue Pre-procedure neurovascular assessment: neurovascularly intact Pre-procedure distal perfusion: normal Pre-procedure neurological function: normal Pre-procedure range of motion: normal  Anesthesia: Local anesthesia used: no  Patient sedated: NoImmobilization: brace Splint Applied by: Ortho Tech Supplies used: elastic bandage Post-procedure neurovascular assessment: post-procedure  neurovascularly intact Post-procedure distal perfusion: normal Post-procedure neurological function: normal Post-procedure range of motion: normal     Medications Ordered in ED Medications  ibuprofen (ADVIL) tablet 400 mg (400 mg Oral Given 11/15/20 0749)  oxyCODONE-acetaminophen (  PERCOCET/ROXICET) 5-325 MG per tablet 1 tablet (1 tablet Oral Given 11/15/20 0919)    ED Course  I have reviewed the triage vital signs and the nursing notes.  Pertinent labs & imaging results that were available during my care of the patient were reviewed by me and considered in my medical decision making (see chart for details).  Mechanical fall PTA. Non septic, non ill appearing.  Midline spinal tenderness, crepitus or step-off.  No LOC, anticoagulation, hitting head.  Ambulatory since the fall.  No preceding symptoms prior to fall.  Tetanus up-to-date.  Does have small abrasion to left anterior knee however no lacerations to suture.  Left distal radius with likely fracture.  Full range of motion all 4 extremities aside from left wrist.  He is neurovascularly intact.  X-rays personally reviewed and interpreted>> Left wrist with comminuted distal radius fracture with possible minimal displacement Left ankle with joint effusion however no obvious fracture or dislocation. Left knee without acute findings   Patient placed in knee splint, sugar-tong for radial fracture.   Patient neurovascularly intact after splint placement.  We will have him follow-up with orthopedics given besides radial fracture he does have some persistent left knee pain and ankle pain.  Ambulatory here without difficulty  The patient has been appropriately medically screened and/or stabilized in the ED. I have low suspicion for any other emergent medical condition which would require further screening, evaluation or treatment in the ED or require inpatient management.  Patient is hemodynamically stable and in no acute distress.   Patient able to ambulate in department prior to ED.  Evaluation does not show acute pathology that would require ongoing or additional emergent interventions while in the emergency department or further inpatient treatment.  I have discussed the diagnosis with the patient and answered all questions.  Pain is been managed while in the emergency department and patient has no further complaints prior to discharge.  Patient is comfortable with plan discussed in room and is stable for discharge at this time.  I have discussed strict return precautions for returning to the emergency department.  Patient was encouraged to follow-up with PCP/specialist refer to at discharge.       MDM Rules/Calculators/A&P                           Final Clinical Impression(s) / ED Diagnoses Final diagnoses:  Fall, initial encounter  Closed fracture of distal end of left radius, unspecified fracture morphology, initial encounter  Acute pain of left knee  Left ankle swelling    Rx / DC Orders ED Discharge Orders          Ordered    oxyCODONE (ROXICODONE) 5 MG immediate release tablet  Every 4 hours PRN        11/15/20 1201             Michayla Mcneil A, PA-C 11/15/20 1208    Charlesetta Shanks, MD 11/24/20 [phone]

## 2020-11-15 NOTE — ED Notes (Signed)
RN reviewed discharge instructions w/ pt. Follow up, pain management, and prescriptions reviewed. Pt has no further questions.

## 2020-11-15 NOTE — Progress Notes (Signed)
Orthopedic Tech Progress Note Patient Details:  Antonio Moore 12-05-1970 [id number]  Ortho Devices Type of Ortho Device: Sugartong splint, Knee Sleeve Ortho Device/Splint Location: LUE,LLE Ortho Device/Splint Interventions: Ordered, Application, Adjustment   Post Interventions Patient Tolerated: Well Instructions Provided: Care of device  [name] 11/15/2020, 12:01 PM

## 2020-11-15 NOTE — Discharge Instructions (Addendum)
Follow up with Orthopedics Dr. Marlou Sa  Call to schedule and appointment  Return for new or worsening symptoms

## 2020-11-15 NOTE — Telephone Encounter (Signed)
Patient called he was seen in the ED this morning and he has a broken wrist he stated the ED told him he needs to be seen in the next 24 hrs call back:[phone]

## 2020-11-15 NOTE — ED Triage Notes (Signed)
Pt reports tripping and falling this morning. Has left ankle pain and left wrist pain.

## 2020-11-15 NOTE — Telephone Encounter (Signed)
Scheduled appt for Wednesday

## 2020-11-17 ENCOUNTER — Encounter: Payer: Self-pay | Admitting: Orthopedic Surgery

## 2020-11-17 ENCOUNTER — Ambulatory Visit (INDEPENDENT_AMBULATORY_CARE_PROVIDER_SITE_OTHER): Payer: BC Managed Care – PPO | Admitting: Orthopedic Surgery

## 2020-11-17 DIAGNOSIS — S52572A Other intraarticular fracture of lower end of left radius, initial encounter for closed fracture: Secondary | ICD-10-CM

## 2020-11-17 NOTE — Progress Notes (Signed)
Office Visit Note   Patient: Antonio Moore           Date of Birth: Oct 07, 1970           MRN: [id number] Visit Date: 11/17/2020 Requested by: Horald Pollen, MD [place],  [place] [address] PCP: Horald Pollen, MD  Subjective: Chief Complaint  Patient presents with   Left Wrist - Injury    DOI 11/15/20    HPI: Caison is a 50 year old patient with left wrist injury.  Date of injury 11/15/2020.  He had a fall.  He is right-hand dominant.  No history of diabetes.  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  [place].  He has wife and son at home.              ROS: All systems reviewed are negative as they relate to the chief complaint within the history of present illness.  Patient denies  fevers or chills.   Assessment & Plan: Visit Diagnoses:  1. Other closed intra-articular fracture of distal end of left radius, initial encounter     Plan: Impression is intra-articular distal radius fracture with fairly significant loss of radial inclination.  Volar tilt and height are okay.  Fracture is intra-articular.  In general I think he would do better with open reduction internal fixation of the fracture to restore some of that inclination.  Risk and benefits of surgery discussed with the patient including not limited to infection nerve vessel damage potential need for hardware removal as well as development of arthritis in the wrist later on.  Patient understands risk and benefits and wishes to proceed with surgical intervention which I think would be a good option for him at this time.  All questions answered.  Follow-Up Instructions: No follow-ups on file.   Orders:  No orders of the defined types were placed in this encounter.  No orders of the defined types were placed in this encounter.     Procedures: No procedures performed   Clinical Data: No additional  findings.  Objective: Vital Signs: There were no vitals taken for this visit.  Physical Exam:   Constitutional: Patient appears well-developed HEENT:  Head: Normocephalic Eyes:EOM are normal Neck: Normal range of motion Cardiovascular: Normal rate Pulmonary/chest: Effort normal Neurologic: Patient is alert Skin: Skin is warm Psychiatric: Patient has normal mood and affect   Ortho Exam: Ortho exam demonstrates mild to moderate swelling around the left wrist with no fracture blisters.  Radial pulses intact.  No paresthesias median radial or ulnar distribution.  Elbow range of motion nontender and intact.  Shoulder range of motion intact but he does have some pain anteriorly.  Radiographs are reviewed and shows intra-articular three-part distal radius fracture.  Specialty Comments:  No specialty comments available.  Imaging: No results found.   PMFS History: Patient Active Problem List   Diagnosis Date Noted   Premature ventricular contractions (PVCs) (VPCs) 03/10/2020   Past Medical History:  Diagnosis Date   Anemia    late teens , early 92's     Family History  Problem Relation Age of Onset   Stroke Mother    Stroke Father    Stroke Sister    Cancer Brother    Pancreatic cancer Brother    Colon cancer Neg Hx    Colon polyps Neg Hx    Esophageal  cancer Neg Hx    Rectal cancer Neg Hx    Stomach cancer Neg Hx     Past Surgical History:  Procedure Laterality Date   WISDOM TOOTH EXTRACTION     Social History   Occupational History   Not on file  Tobacco Use   Smoking status: Never   Smokeless tobacco: Never  Vaping Use   Vaping Use: Never used  Substance and Sexual Activity   Alcohol use: No   Drug use: No   Sexual activity: Not on file

## 2020-11-18 ENCOUNTER — Encounter (HOSPITAL_COMMUNITY): Payer: Self-pay | Admitting: Orthopedic Surgery

## 2020-11-18 ENCOUNTER — Other Ambulatory Visit: Payer: Self-pay

## 2020-11-18 NOTE — Progress Notes (Addendum)
EKG: 03/23/20 CXR: 02/08/1999 ECHO: denies Stress Test: denies Cardiac Cath: denies  Fasting Blood Sugar- na Checks Blood Sugar_na__ times a day  OSA/CPAP: No  ASA/Blood Thinners: No  Covid test  not needed  Anesthesia Review: No  Patient denies shortness of breath, fever, cough, and chest pain at PAT appointment.  Patient verbalized understanding of instructions provided today at the PAT appointment.  Patient asked to review instructions at home and day of surgery.

## 2020-11-19 ENCOUNTER — Ambulatory Visit (HOSPITAL_COMMUNITY): Payer: BC Managed Care – PPO

## 2020-11-19 ENCOUNTER — Ambulatory Visit (HOSPITAL_COMMUNITY): Payer: BC Managed Care – PPO | Admitting: Anesthesiology

## 2020-11-19 ENCOUNTER — Encounter (HOSPITAL_COMMUNITY): Payer: Self-pay | Admitting: Orthopedic Surgery

## 2020-11-19 ENCOUNTER — Encounter (HOSPITAL_COMMUNITY)
Admission: RE | Disposition: A | Discharge status: Home / Self Care | Payer: Self-pay | Source: Home / Self Care | Attending: Orthopedic Surgery

## 2020-11-19 ENCOUNTER — Ambulatory Visit (HOSPITAL_COMMUNITY)
Admission: RE | Admit: 2020-11-19 | Discharge: 2020-11-19 | Disposition: A | Discharge status: Home / Self Care | Payer: BC Managed Care – PPO | Attending: Orthopedic Surgery | Admitting: Orthopedic Surgery

## 2020-11-19 DIAGNOSIS — S52572D Other intraarticular fracture of lower end of left radius, subsequent encounter for closed fracture with routine healing: Secondary | ICD-10-CM

## 2020-11-19 DIAGNOSIS — Z79899 Other long term (current) drug therapy: Secondary | ICD-10-CM | POA: Insufficient documentation

## 2020-11-19 DIAGNOSIS — W19XXXA Unspecified fall, initial encounter: Secondary | ICD-10-CM | POA: Diagnosis not present

## 2020-11-19 DIAGNOSIS — S52572A Other intraarticular fracture of lower end of left radius, initial encounter for closed fracture: Secondary | ICD-10-CM | POA: Diagnosis not present

## 2020-11-19 DIAGNOSIS — S52502D Unspecified fracture of the lower end of left radius, subsequent encounter for closed fracture with routine healing: Secondary | ICD-10-CM

## 2020-11-19 HISTORY — PX: OPEN REDUCTION INTERNAL FIXATION (ORIF) DISTAL RADIAL FRACTURE: SHX5989

## 2020-11-19 LAB — BASIC METABOLIC PANEL
Anion gap: 11 (ref 5–15)
BUN: <5 mg/dL — ABNORMAL LOW (ref 6–20)
CO2: 19 mmol/L — ABNORMAL LOW (ref 22–32)
Calcium: 7.5 mg/dL — ABNORMAL LOW (ref 8.9–10.3)
Chloride: 104 mmol/L (ref 98–111)
Creatinine, Ser: 0.55 mg/dL — ABNORMAL LOW (ref 0.61–1.24)
GFR, Estimated: >60 mL/min (ref >60)
Glucose, Bld: 52 mg/dL — ABNORMAL LOW (ref 70–99)
Potassium: 3.9 mmol/L (ref 3.5–5.1)
Sodium: 134 mmol/L — ABNORMAL LOW (ref 135–145)

## 2020-11-19 LAB — CBC
HCT: 41.1 % (ref 39.0–52.0)
Hemoglobin: 14.1 g/dL (ref 13.0–17.0)
MCH: 30.2 pg (ref 26.0–34.0)
MCHC: 34.3 g/dL (ref 30.0–36.0)
MCV: 88 fL (ref 80.0–100.0)
Platelets: 168 10*3/uL (ref 150–400)
RBC: 4.67 MIL/uL (ref 4.22–5.81)
RDW: 13.2 % (ref 11.5–15.5)
WBC: 3.3 10*3/uL — ABNORMAL LOW (ref 4.0–10.5)
nRBC: 0 % (ref 0.0–0.2)

## 2020-11-19 SURGERY — OPEN REDUCTION INTERNAL FIXATION (ORIF) DISTAL RADIUS FRACTURE
Anesthesia: Monitor Anesthesia Care | Site: Wrist | Laterality: Left

## 2020-11-19 MED ORDER — VANCOMYCIN HCL 1000 MG IV SOLR
INTRAVENOUS | Status: DC | PRN
  Administered 2020-11-19: 1000 mg

## 2020-11-19 MED ORDER — DEXAMETHASONE SODIUM PHOSPHATE 10 MG/ML IJ SOLN
INTRAMUSCULAR | Status: DC | PRN
  Administered 2020-11-19: 10 mg via INTRAVENOUS

## 2020-11-19 MED ORDER — POVIDONE-IODINE 7.5 % EX SOLN
Freq: Once | CUTANEOUS | Status: DC
  Filled 2020-11-19: qty 118

## 2020-11-19 MED ORDER — DEXAMETHASONE SODIUM PHOSPHATE 10 MG/ML IJ SOLN
INTRAMUSCULAR | Status: AC
  Filled 2020-11-19: qty 1

## 2020-11-19 MED ORDER — PROPOFOL 500 MG/50ML IV EMUL
INTRAVENOUS | Status: DC | PRN
  Administered 2020-11-19: 100 ug/kg/min via INTRAVENOUS

## 2020-11-19 MED ORDER — KETOROLAC TROMETHAMINE 10 MG PO TABS
10.0000 mg | ORAL_TABLET | Freq: Three times a day (TID) | ORAL | 0 refills | Status: DC | PRN
Start: 2020-11-19 — End: 2021-06-06

## 2020-11-19 MED ORDER — ONDANSETRON HCL 4 MG/2ML IJ SOLN
INTRAMUSCULAR | Status: AC
  Filled 2020-11-19: qty 2

## 2020-11-19 MED ORDER — MIDAZOLAM HCL 2 MG/2ML IJ SOLN
INTRAMUSCULAR | Status: AC
  Filled 2020-11-19: qty 2

## 2020-11-19 MED ORDER — DEXAMETHASONE SODIUM PHOSPHATE 10 MG/ML IJ SOLN
INTRAMUSCULAR | Status: DC | PRN
  Administered 2020-11-19: 10 mg

## 2020-11-19 MED ORDER — PROPOFOL 1000 MG/100ML IV EMUL
INTRAVENOUS | Status: AC
  Filled 2020-11-19: qty 100

## 2020-11-19 MED ORDER — LACTATED RINGERS IV SOLN: INTRAVENOUS | Status: DC

## 2020-11-19 MED ORDER — EPHEDRINE 5 MG/ML INJ
INTRAVENOUS | Status: AC
  Filled 2020-11-19: qty 10

## 2020-11-19 MED ORDER — ONDANSETRON HCL 4 MG/2ML IJ SOLN
INTRAMUSCULAR | Status: DC | PRN
  Administered 2020-11-19: 4 mg via INTRAVENOUS

## 2020-11-19 MED ORDER — FENTANYL CITRATE (PF) 250 MCG/5ML IJ SOLN
INTRAMUSCULAR | Status: AC
  Filled 2020-11-19: qty 5

## 2020-11-19 MED ORDER — CHLORHEXIDINE GLUCONATE 0.12 % MT SOLN
15.0000 mL | Freq: Once | OROMUCOSAL | Status: AC
  Administered 2020-11-19: 15 mL via OROMUCOSAL
  Filled 2020-11-19: qty 15

## 2020-11-19 MED ORDER — VANCOMYCIN HCL 1000 MG IV SOLR
INTRAVENOUS | Status: AC
  Filled 2020-11-19: qty 1000

## 2020-11-19 MED ORDER — MIDAZOLAM HCL 2 MG/2ML IJ SOLN: 2.0000 mg | Freq: Once | INTRAMUSCULAR | Status: AC

## 2020-11-19 MED ORDER — POVIDONE-IODINE 10 % EX SWAB
2.0000 "application " | Freq: Once | CUTANEOUS | Status: AC
  Administered 2020-11-19: 2 via TOPICAL

## 2020-11-19 MED ORDER — OXYCODONE-ACETAMINOPHEN 5-325 MG PO TABS: 1.0000 | ORAL_TABLET | ORAL | 0 refills | Status: DC | PRN

## 2020-11-19 MED ORDER — ORAL CARE MOUTH RINSE: 15.0000 mL | Freq: Once | OROMUCOSAL | Status: AC

## 2020-11-19 MED ORDER — LIDOCAINE 2% (20 MG/ML) 5 ML SYRINGE
INTRAMUSCULAR | Status: AC
  Filled 2020-11-19: qty 5

## 2020-11-19 MED ORDER — PROPOFOL 10 MG/ML IV BOLUS
INTRAVENOUS | Status: AC
  Filled 2020-11-19: qty 20

## 2020-11-19 MED ORDER — 0.9 % SODIUM CHLORIDE (POUR BTL) OPTIME
TOPICAL | Status: DC | PRN
  Administered 2020-11-19: 1000 mL

## 2020-11-19 MED ORDER — ROPIVACAINE HCL 5 MG/ML IJ SOLN
INTRAMUSCULAR | Status: DC | PRN
  Administered 2020-11-19: 30 mL via PERINEURAL

## 2020-11-19 MED ORDER — FENTANYL CITRATE (PF) 100 MCG/2ML IJ SOLN
100.0000 ug | Freq: Once | INTRAMUSCULAR | Status: AC
Start: 2020-11-19 — End: 2020-11-19

## 2020-11-19 MED ORDER — CEFAZOLIN SODIUM-DEXTROSE 2-4 GM/100ML-% IV SOLN
2.0000 g | INTRAVENOUS | Status: AC
  Administered 2020-11-19: 2 g via INTRAVENOUS
  Filled 2020-11-19: qty 100

## 2020-11-19 MED ORDER — PROPOFOL 10 MG/ML IV BOLUS
INTRAVENOUS | Status: DC | PRN
  Administered 2020-11-19: 20 mg via INTRAVENOUS
  Administered 2020-11-19: 30 mg via INTRAVENOUS

## 2020-11-19 MED ORDER — METHOCARBAMOL 500 MG PO TABS: 500.0000 mg | ORAL_TABLET | Freq: Three times a day (TID) | ORAL | 0 refills | Status: DC | PRN

## 2020-11-19 MED ORDER — FENTANYL CITRATE (PF) 100 MCG/2ML IJ SOLN
INTRAMUSCULAR | Status: AC
  Administered 2020-11-19: 100 ug via INTRAVENOUS
  Filled 2020-11-19: qty 2

## 2020-11-19 MED ORDER — EPHEDRINE SULFATE-NACL 50-0.9 MG/10ML-% IV SOSY
PREFILLED_SYRINGE | INTRAVENOUS | Status: DC | PRN
  Administered 2020-11-19: 5 mg via INTRAVENOUS
  Administered 2020-11-19: 10 mg via INTRAVENOUS

## 2020-11-19 MED ORDER — BUPIVACAINE HCL (PF) 0.25 % IJ SOLN
INTRAMUSCULAR | Status: AC
  Filled 2020-11-19: qty 30

## 2020-11-19 MED ORDER — MIDAZOLAM HCL 2 MG/2ML IJ SOLN
INTRAMUSCULAR | Status: AC
  Administered 2020-11-19: 2 mg via INTRAVENOUS
  Filled 2020-11-19: qty 2

## 2020-11-19 MED ORDER — LIDOCAINE 2% (20 MG/ML) 5 ML SYRINGE
INTRAMUSCULAR | Status: DC | PRN
  Administered 2020-11-19: 40 mg via INTRAVENOUS

## 2020-11-19 SURGICAL SUPPLY — 82 items
BIT DRILL 2.2 SS TIBIAL (BIT) ×3 IMPLANT
BLADE SURG 10 STRL SS (BLADE) ×3 IMPLANT
BNDG CMPR 9X4 STRL LF SNTH (GAUZE/BANDAGES/DRESSINGS) ×1
BNDG COHESIVE 3X5 TAN STRL LF (GAUZE/BANDAGES/DRESSINGS) ×3 IMPLANT
BNDG ELASTIC 4X5.8 VLCR STR LF (GAUZE/BANDAGES/DRESSINGS) ×3 IMPLANT
BNDG ESMARK 4X9 LF (GAUZE/BANDAGES/DRESSINGS) ×3 IMPLANT
BNDG GAUZE ELAST 4 BULKY (GAUZE/BANDAGES/DRESSINGS) IMPLANT
CLOSURE WOUND 1/2 X4 (GAUZE/BANDAGES/DRESSINGS) ×1
CORD BIPOLAR FORCEPS 12FT (ELECTRODE) ×3 IMPLANT
COVER SURGICAL LIGHT HANDLE (MISCELLANEOUS) ×3 IMPLANT
COVER WAND RF STERILE (DRAPES) ×3 IMPLANT
CUFF TOURN SGL QUICK 18X4 (TOURNIQUET CUFF) ×3 IMPLANT
CUFF TOURN SGL QUICK 24 (TOURNIQUET CUFF)
CUFF TRNQT CYL 24X4X16.5-23 (TOURNIQUET CUFF) IMPLANT
DRAIN TLS ROUND 10FR (DRAIN) IMPLANT
DRAPE INCISE IOBAN 66X45 STRL (DRAPES) ×3 IMPLANT
DRAPE OEC MINIVIEW 54X84 (DRAPES) IMPLANT
DRAPE U-SHAPE 47X51 STRL (DRAPES) ×3 IMPLANT
DRIVER BIT SQUARE 1.7/2.2 (TRAUMA) ×3 IMPLANT
DRSG AQUACEL AG ADV 3.5X 6 (GAUZE/BANDAGES/DRESSINGS) ×3 IMPLANT
DRSG PAD ABDOMINAL 8X10 ST (GAUZE/BANDAGES/DRESSINGS) ×3 IMPLANT
DURAPREP 26ML APPLICATOR (WOUND CARE) ×3 IMPLANT
ELECT REM PT RETURN 9FT ADLT (ELECTROSURGICAL) ×3
ELECTRODE REM PT RTRN 9FT ADLT (ELECTROSURGICAL) ×1 IMPLANT
FACESHIELD WRAPAROUND (MASK) ×3 IMPLANT
GAUZE SPONGE 4X4 12PLY STRL (GAUZE/BANDAGES/DRESSINGS) IMPLANT
GAUZE SPONGE 4X4 12PLY STRL LF (GAUZE/BANDAGES/DRESSINGS) ×3 IMPLANT
GAUZE XEROFORM 1X8 LF (GAUZE/BANDAGES/DRESSINGS) IMPLANT
GLOVE SRG 8 PF TXTR STRL LF DI (GLOVE) ×2 IMPLANT
GLOVE SURG ENC MOIS LTX SZ9 (GLOVE) ×3 IMPLANT
GLOVE SURG LTX SZ8 (GLOVE) ×3 IMPLANT
GLOVE SURG POLYISO LF SZ8 (GLOVE) ×3 IMPLANT
GLOVE SURG UNDER POLY LF SZ8 (GLOVE) ×6
GOWN STRL REUS W/ TWL LRG LVL3 (GOWN DISPOSABLE) ×2 IMPLANT
GOWN STRL REUS W/ TWL XL LVL3 (GOWN DISPOSABLE) ×1 IMPLANT
GOWN STRL REUS W/TWL LRG LVL3 (GOWN DISPOSABLE) ×6
GOWN STRL REUS W/TWL XL LVL3 (GOWN DISPOSABLE) ×3
K-WIRE 1.6 (WIRE) ×3
K-WIRE FX5X1.6XNS BN SS (WIRE) ×1
KIT BASIN OR (CUSTOM PROCEDURE TRAY) ×3 IMPLANT
KIT TURNOVER KIT B (KITS) ×3 IMPLANT
KWIRE FX5X1.6XNS BN SS (WIRE) ×1 IMPLANT
MANIFOLD NEPTUNE II (INSTRUMENTS) ×3 IMPLANT
NEEDLE 22X1 1/2 (OR ONLY) (NEEDLE) IMPLANT
NS IRRIG 1000ML POUR BTL (IV SOLUTION) ×3 IMPLANT
PACK ORTHO EXTREMITY (CUSTOM PROCEDURE TRAY) ×3 IMPLANT
PAD ARMBOARD 7.5X6 YLW CONV (MISCELLANEOUS) ×6 IMPLANT
PAD CAST 3X4 CTTN HI CHSV (CAST SUPPLIES) ×1 IMPLANT
PAD CAST 4YDX4 CTTN HI CHSV (CAST SUPPLIES) ×1 IMPLANT
PADDING CAST COTTON 3X4 STRL (CAST SUPPLIES) ×3
PADDING CAST COTTON 4X4 STRL (CAST SUPPLIES) ×3
PEG LOCKING SMOOTH 2.2X18 (Peg) ×3 IMPLANT
PEG LOCKING SMOOTH 2.2X20 (Screw) ×3 IMPLANT
PEG LOCKING SMOOTH 2.2X22 (Screw) ×3 IMPLANT
PLATE WIDE DVR LEFT (Plate) ×3 IMPLANT
SCREW  LP NL 2.7X16MM (Screw) ×9 IMPLANT
SCREW LOCK 16X2.7X 3 LD TPR (Screw) ×2 IMPLANT
SCREW LOCK 18X2.7X 3 LD TPR (Screw) ×1 IMPLANT
SCREW LOCK 20X2.7X 3 LD TPR (Screw) ×3 IMPLANT
SCREW LOCKING 2.7X16 (Screw) ×6 IMPLANT
SCREW LOCKING 2.7X18 (Screw) ×3 IMPLANT
SCREW LOCKING 2.7X20MM (Screw) ×9 IMPLANT
SCREW LP NL 2.7X16MM (Screw) ×3 IMPLANT
SPLINT PLASTER CAST XFAST 4X15 (CAST SUPPLIES) ×1 IMPLANT
SPLINT PLASTER XTRA FAST SET 4 (CAST SUPPLIES) ×2
SPONGE LAP 4X18 RFD (DISPOSABLE) ×6 IMPLANT
STAPLER VISISTAT 35W (STAPLE) IMPLANT
STRIP CLOSURE SKIN 1/2X4 (GAUZE/BANDAGES/DRESSINGS) ×2 IMPLANT
SUCTION FRAZIER HANDLE 10FR (MISCELLANEOUS) ×3
SUCTION TUBE FRAZIER 10FR DISP (MISCELLANEOUS) ×1 IMPLANT
SUT ETHILON 3 0 PS 1 (SUTURE) IMPLANT
SUT PROLENE 3 0 PS 1 (SUTURE) IMPLANT
SUT VIC AB 2-0 CTB1 (SUTURE) IMPLANT
SUT VIC AB 3-0 X1 27 (SUTURE) IMPLANT
SYR CONTROL 10ML LL (SYRINGE) IMPLANT
SYSTEM CHEST DRAIN TLS 7FR (DRAIN) IMPLANT
TOWEL GREEN STERILE (TOWEL DISPOSABLE) ×3 IMPLANT
TOWEL GREEN STERILE FF (TOWEL DISPOSABLE) ×3 IMPLANT
TUBE CONNECTING 12'X1/4 (SUCTIONS) ×1
TUBE CONNECTING 12X1/4 (SUCTIONS) ×2 IMPLANT
WATER STERILE IRR 1000ML POUR (IV SOLUTION) ×3 IMPLANT
YANKAUER SUCT BULB TIP NO VENT (SUCTIONS) IMPLANT

## 2020-11-19 NOTE — Anesthesia Procedure Notes (Signed)
Anesthesia Regional Block: Supraclavicular block   Pre-Anesthetic Checklist: , timeout performed,  Correct Patient, Correct Site, Correct Laterality,  Correct Procedure, Correct Position, site marked,  Risks and benefits discussed,  Surgical consent,  Pre-op evaluation,  At surgeon's request and post-op pain management  Laterality: Left  Prep: chloraprep       Needles:  Injection technique: Single-shot  Needle Type: Echogenic Stimulator Needle     Needle Length: 10cm  Needle Gauge: 20     Additional Needles:   Procedures:,,,, ultrasound used (permanent image in chart),,    Narrative:  Start time: 11/19/2020 10:47 AM End time: 11/19/2020 10:51 AM Injection made incrementally with aspirations every 5 mL.  Performed by: Personally  Anesthesiologist: Lidia Collum, MD  Additional Notes: Standard monitors applied. Skin prepped. Good needle visualization with ultrasound. Injection made in 5cc increments with no resistance to injection. Patient tolerated the procedure well.

## 2020-11-19 NOTE — H&P (Signed)
Antonio Moore is an 50 y.o. male.   Chief Complaint: left wrist pain HPI:  Antonio Moore is a 50 year old patient with left wrist injury.  Date of injury 11/15/2020.  He had a fall.  He is right-hand dominant.  No history of diabetes.  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  Does [place] a [place].  He has wife and son at home.  Past Medical History:  Diagnosis Date   Anemia    late teens , early 20's     Past Surgical History:  Procedure Laterality Date   NO PAST SURGERIES     WISDOM TOOTH EXTRACTION      Family History  Problem Relation Age of Onset   Stroke Mother    Stroke Father    Stroke Sister    Cancer Brother    Pancreatic cancer Brother    Colon cancer Neg Hx    Colon polyps Neg Hx    Esophageal cancer Neg Hx    Rectal cancer Neg Hx    Stomach cancer Neg Hx    Social History:  reports that he has never smoked. He has never used smokeless tobacco. He reports that he does not drink alcohol and does not use drugs.  Allergies:  Allergies  Allergen Reactions   Wasp Venom Swelling    Medications Prior to Admission  Medication Sig Dispense Refill   acetaminophen (TYLENOL) 500 MG tablet Take 1,000 mg by mouth every 6 (six) hours as needed for moderate pain.     Multiple Vitamin (MULTI-VITAMIN PO) Take 1 tablet by mouth daily.     OMEGA-3 FATTY ACIDS PO Take 2 capsules by mouth daily.      Results for orders placed or performed during the hospital encounter of 11/19/20 (from the past 48 hour(s))  CBC     Status: Abnormal   Collection Time: 11/19/20  9:33 AM  Result Value Ref Range   WBC 3.3 (L) 4.0 - 10.5 K/uL   RBC 4.67 4.22 - 5.81 MIL/uL   Hemoglobin 14.1 13.0 - 17.0 g/dL   HCT 41.1 39.0 - 52.0 %   MCV 88.0 80.0 - 100.0 fL   MCH 30.2 26.0 - 34.0 pg   MCHC 34.3 30.0 - 36.0 g/dL   RDW 13.2 11.5 - 15.5 %   Platelets 168 150 - 400 K/uL    Comment: REPEATED TO VERIFY   nRBC 0.0  0.0 - 0.2 %    Comment: Performed at [facility], [place] [address] [place], [place] [address]   No results found.  Review of Systems  Musculoskeletal:  Positive for arthralgias.  All other systems reviewed and are negative.  Blood pressure 104/60, pulse 63, temperature 98.2 F (36.8 C), temperature source Oral, resp. rate 18, height 5\' 11"  (1.803 m), weight 84.8 kg, SpO2 100 %. Physical Exam Vitals reviewed.  HENT:     Head: Normocephalic.     Nose: Nose normal.     Mouth/Throat:     Mouth: Mucous membranes are moist.  Eyes:     Pupils: Pupils are equal, round, and reactive to light.  Cardiovascular:     Rate and Rhythm: Normal rate.     Pulses: Normal pulses.  Pulmonary:     Effort: Pulmonary effort is normal.  Abdominal:     General: Abdomen is flat.  Musculoskeletal:     Cervical back: Normal  range of motion.  Skin:    General: Skin is warm.     Capillary Refill: Capillary refill takes less than 2 seconds.  Neurological:     General: No focal deficit present.     Mental Status: He is alert.  Psychiatric:        Mood and Affect: Mood normal.    Ortho exam demonstrates mild to moderate swelling around the left wrist with no fracture blisters.  Radial pulses intact.  No paresthesias median radial or ulnar distribution.  Elbow range of motion nontender and intact.  Shoulder range of motion intact but he does have some pain anteriorly.  Radiographs are reviewed and shows intra-articular three-part distal radius fracture  Assessment/Plan Impression is intra-articular distal radius fracture with fairly significant loss of radial inclination.  Volar tilt and height are okay.  Fracture is intra-articular.  In general I think he would do better with open reduction internal fixation of the fracture to restore some of that inclination.  Risk and benefits of surgery discussed with the patient including not limited to infection nerve vessel damage potential need for hardware  removal as well as development of arthritis in the wrist later on.  Patient understands risk and benefits and wishes to proceed with surgical intervention which I think would be a good option for him at this time.  All questions answered  Anderson Malta, MD 11/19/2020, 11:12 AM

## 2020-11-19 NOTE — Anesthesia Postprocedure Evaluation (Signed)
Anesthesia Post Note  Patient: Antonio Moore  Procedure(s) Performed: OPEN REDUCTION INTERNAL FIXATION (ORIF) DISTAL RADIUS FRACTURE (Left: Wrist)     Patient location during evaluation: PACU Anesthesia Type: MAC Level of consciousness: awake and alert Pain management: pain level controlled Vital Signs Assessment: post-procedure vital signs reviewed and stable Respiratory status: spontaneous breathing, nonlabored ventilation and respiratory function stable Cardiovascular status: blood pressure returned to baseline and stable Postop Assessment: no apparent nausea or vomiting Anesthetic complications: no   No notable events documented.  Last Vitals:  Vitals:   11/19/20 1330 11/19/20 1345  BP: 111/61 106/65  Pulse: (!) 55 60  Resp: 14 16  Temp: 36.5 C   SpO2: 100% 100%    Last Pain:  Vitals:   11/19/20 1330  TempSrc:   PainSc: 0-No pain                 Lidia Collum

## 2020-11-19 NOTE — Brief Op Note (Signed)
   11/19/2020  1:17 PM  PATIENT:  Antonio Moore  50 y.o. male  PRE-OPERATIVE DIAGNOSIS:  left distal radius fracture  POST-OPERATIVE DIAGNOSIS:  left distal radius fracturex  PROCEDURE:  Procedure(s): OPEN REDUCTION INTERNAL FIXATION (ORIF) DISTAL RADIUS FRACTURE  SURGEON:  Surgeon(s): Meredith Pel, MD  ASSISTANT: magnant pa  ANESTHESIA:   regional  EBL: 10 ml    Total I/O In: 1100 [I.V.:1000; IV Piggyback:100] Out: 5 [Blood:5]  BLOOD ADMINISTERED: none  DRAINS: none   LOCAL MEDICATIONS USED:  vanco  SPECIMEN:  No Specimen  COUNTS:  YES  TOURNIQUET:  56 at 250  DICTATION: .Other Dictation: Dictation Number [id number]  PLAN OF CARE: Discharge to home after PACU  PATIENT DISPOSITION:  PACU - hemodynamically stable

## 2020-11-19 NOTE — Anesthesia Preprocedure Evaluation (Addendum)
Anesthesia Evaluation  Patient identified by MRN, date of birth, ID band Patient awake    Reviewed: Allergy & Precautions, NPO status , Patient's Chart, lab work & pertinent test results  History of Anesthesia Complications Negative for: history of anesthetic complications  Airway Mallampati: I  TM Distance: >3 FB Neck ROM: Full    Dental  (+) Teeth Intact, Dental Advisory Given   Pulmonary neg pulmonary ROS,    Pulmonary exam normal        Cardiovascular negative cardio ROS Normal cardiovascular exam     Neuro/Psych negative neurological ROS     GI/Hepatic negative GI ROS, Neg liver ROS,   Endo/Other  negative endocrine ROS  Renal/GU negative Renal ROS  negative genitourinary   Musculoskeletal negative musculoskeletal ROS (+)   Abdominal   Peds  Hematology negative hematology ROS (+)   Anesthesia Other Findings   Reproductive/Obstetrics                           Anesthesia Physical Anesthesia Plan  ASA: 1  Anesthesia Plan: MAC   Post-op Pain Management:  Regional for Post-op pain   Induction: Intravenous  PONV Risk Score and Plan: 1 and Propofol infusion, TIVA and Treatment may vary due to age or medical condition  Airway Management Planned: Natural Airway, Nasal Cannula and Simple Face Mask  Additional Equipment: None  Intra-op Plan:   Post-operative Plan:   Informed Consent: I have reviewed the patients History and Physical, chart, labs and discussed the procedure including the risks, benefits and alternatives for the proposed anesthesia with the patient or authorized representative who has indicated his/her understanding and acceptance.       Plan Discussed with:   Anesthesia Plan Comments:         Anesthesia Quick Evaluation

## 2020-11-19 NOTE — Transfer of Care (Signed)
Immediate Anesthesia Transfer of Care Note  Patient: Tommie Raymond Messias Opheim  Procedure(s) Performed: OPEN REDUCTION INTERNAL FIXATION (ORIF) DISTAL RADIUS FRACTURE (Left)  Patient Location: PACU  Anesthesia Type:MAC and Regional  Level of Consciousness: oriented, sedated and patient cooperative  Airway & Oxygen Therapy: Patient Spontanous Breathing and Patient connected to face mask oxygen  Post-op Assessment: Report given to RN and Post -op Vital signs reviewed and stable  Post vital signs: Reviewed  Last Vitals:  Vitals Value Taken Time  BP 111/61 11/19/20 1329  Temp    Pulse 69 11/19/20 1330  Resp 17 11/19/20 1330  SpO2 100 % 11/19/20 1330  Vitals shown include unvalidated device data.  Last Pain:  Vitals:   11/19/20 0954  TempSrc:   PainSc: 3          Complications: No notable events documented.

## 2020-11-19 NOTE — Anesthesia Procedure Notes (Signed)
Procedure Name: MAC Date/Time: 11/19/2020 11:40 AM Performed by: Jenne Campus, CRNA Pre-anesthesia Checklist: Patient identified, Emergency Drugs available, Suction available and Patient being monitored Oxygen Delivery Method: Simple face mask

## 2020-11-20 ENCOUNTER — Encounter: Payer: Self-pay | Admitting: Orthopedic Surgery

## 2020-11-21 DIAGNOSIS — S52502D Unspecified fracture of the lower end of left radius, subsequent encounter for closed fracture with routine healing: Secondary | ICD-10-CM

## 2020-11-22 NOTE — Op Note (Signed)
NAME: Antonio Moore, Antonio Moore MEDICAL RECORD NO: [id number] ACCOUNT NO: [id number] DATE OF BIRTH: 1970/06/20 FACILITY: [facility] LOCATION: [facility] PHYSICIAN: Yetta Barre. Marlou Sa, MD  Operative Report   DATE OF PROCEDURE: 11/19/2020  PREOPERATIVE DIAGNOSIS:  Intra-articular left distal radius fracture.  POSTOPERATIVE DIAGNOSIS:  Intra-articular left distal radius fracture.  PROCEDURE:  Open reduction and internal fixation of left distal radius fracture using [employer] plate volar.  SURGEON:  Meredith Pel, MD  ATTENDING: Annie Main, PA  INDICATIONS:  The patient is a 51 year old patient with left wrist pain.  Sustained a fall earlier this week. Presents now for operative management after explanation of risks and benefits.  DESCRIPTION OF PROCEDURE:  The patient was brought to the operating room where regional anesthetic was induced.  Preoperative antibiotics were administered.  Timeout was called.  Left wrist was prescrubbed with alcohol and Betadine, allowed to air dry,  prepped with DuraPrep solution and draped in a sterile manner.  Charlie Pitter was used to cover the operative field.  Timeout was called.  Incision made from the proximal wrist flexion crease along the course of the FCR tendon for about 7 cm  skin and  subcutaneous tissue sharply divided.  FCR tendon sheath was incised and mobilized radially.  The bottom part of the sheath was then incised, and the median nerve was protected.  FCR tendon and the radial artery and vein were also protected.  Pronator  quadratus was elevated off of the radial aspect of the distal radius.  Fracture was identified.  Fracture reduction was performed.  Plate applied with K-wires and good reduction and restoration of radial inclination was confirmed.  Distal combination of  locking screws and pegs were placed, and proximal nonlocking screws were placed for good fracture reduction.  Intra-articular component was well reduced on fluoroscopic images.  No  screws penetrated the cortex also on fluoroscopic imaging.  Tourniquet  was released.  Bleeding points encountered were controlled using bipolar electrocautery.  Thorough irrigation was performed.  Vancomycin powder was placed.  Incision was closed using 2-0 Vicryl and 3-0 nylon with Aquacel dressing and volar splint  applied.  Luke's assistance was required at all times during the case for opening, closing, mobilization of tissue.  His assistance was a medical necessity.  Transferred to the recovery room in stable condition.   ROH D: 11/19/2020 1:21:15 pm T: 11/19/2020 10:08:00 pm  JOB: [id number]/ [id number]

## 2020-11-23 ENCOUNTER — Encounter (HOSPITAL_COMMUNITY): Payer: Self-pay | Admitting: Orthopedic Surgery

## 2020-11-25 ENCOUNTER — Ambulatory Visit: Payer: Self-pay

## 2020-11-25 ENCOUNTER — Ambulatory Visit (INDEPENDENT_AMBULATORY_CARE_PROVIDER_SITE_OTHER): Payer: BC Managed Care – PPO | Admitting: Orthopedic Surgery

## 2020-11-25 DIAGNOSIS — S52572A Other intraarticular fracture of lower end of left radius, initial encounter for closed fracture: Secondary | ICD-10-CM

## 2020-11-27 NOTE — Progress Notes (Signed)
   Post-Op Visit Note   Patient: Antonio Moore           Date of Birth: 05/31/1970           MRN: [id number] Visit Date: 11/25/2020 PCP: Horald Pollen, MD   Assessment & Plan:  Chief Complaint:  Chief Complaint  Patient presents with   Left Wrist - Routine Post Op    11/19/20 left wrist ORIF   Visit Diagnoses:  1. Other closed intra-articular fracture of distal end of left radius, initial encounter     Plan: [place] is a patient is now a week out left wrist open reduction internal fixation.  On exam the incision is intact.  Motor or sensory function to the hand is intact.  X-rays look good.  Plan is 30 repetitions of flexion and extension 3-4 times a day in a removable wrist splint.  Come back in a week for suture removal and initiation of formal occupational therapy.  Follow-Up Instructions: No follow-ups on file.   Orders:  Orders Placed This Encounter  Procedures   XR Wrist 2 Views Left   No orders of the defined types were placed in this encounter.   Imaging: No results found.  PMFS History: Patient Active Problem List   Diagnosis Date Noted   Closed fracture of lower end of left radius with routine healing    Premature ventricular contractions (PVCs) (VPCs) 03/10/2020   Past Medical History:  Diagnosis Date   Anemia    late teens , early 58's     Family History  Problem Relation Age of Onset   Stroke Mother    Stroke Father    Stroke Sister    Cancer Brother    Pancreatic cancer Brother    Colon cancer Neg Hx    Colon polyps Neg Hx    Esophageal cancer Neg Hx    Rectal cancer Neg Hx    Stomach cancer Neg Hx     Past Surgical History:  Procedure Laterality Date   NO PAST SURGERIES     OPEN REDUCTION INTERNAL FIXATION (ORIF) DISTAL RADIAL FRACTURE Left 11/19/2020   Procedure: OPEN REDUCTION INTERNAL FIXATION (ORIF) DISTAL RADIUS FRACTURE;  Surgeon: Meredith Pel, MD;  Location: [facility];  Service: Orthopedics;  Laterality: Left;    WISDOM TOOTH EXTRACTION     Social History   Occupational History   Not on file  Tobacco Use   Smoking status: Never   Smokeless tobacco: Never  Vaping Use   Vaping Use: Never used  Substance and Sexual Activity   Alcohol use: No   Drug use: No   Sexual activity: Not on file

## 2020-12-01 ENCOUNTER — Encounter: Payer: Self-pay | Admitting: Orthopedic Surgery

## 2020-12-02 ENCOUNTER — Encounter: Payer: Self-pay | Admitting: Orthopedic Surgery

## 2020-12-02 ENCOUNTER — Ambulatory Visit: Payer: Self-pay

## 2020-12-02 ENCOUNTER — Ambulatory Visit (INDEPENDENT_AMBULATORY_CARE_PROVIDER_SITE_OTHER): Payer: BC Managed Care – PPO

## 2020-12-02 ENCOUNTER — Ambulatory Visit (INDEPENDENT_AMBULATORY_CARE_PROVIDER_SITE_OTHER): Payer: BC Managed Care – PPO | Admitting: Orthopedic Surgery

## 2020-12-02 DIAGNOSIS — M79672 Pain in left foot: Secondary | ICD-10-CM

## 2020-12-02 DIAGNOSIS — S52572A Other intraarticular fracture of lower end of left radius, initial encounter for closed fracture: Secondary | ICD-10-CM

## 2020-12-02 DIAGNOSIS — M25512 Pain in left shoulder: Secondary | ICD-10-CM | POA: Diagnosis not present

## 2020-12-02 NOTE — Progress Notes (Signed)
Post-Op Visit Note   Patient: Antonio Moore           Date of Birth: 17-Feb-1971           MRN: [id number] Visit Date: 12/02/2020 PCP: Horald Pollen, MD   Assessment & Plan:  Chief Complaint:  Chief Complaint  Patient presents with   Left Wrist - Follow-up   Visit Diagnoses:  1. Acute pain of left shoulder   2. Left foot pain   3. Other closed intra-articular fracture of distal end of left radius, initial encounter     Plan: Patient is a 50 year old male who returns for evaluation of left wrist s/p left wrist ORIF on 11/19/2020.  He is doing well and reports pain is improving but he did have an episode of significant swelling of his left hand and wrist yesterday that he feels was due to him doing a lot of walking around with his arm in a dependent position.  After elevating his hand and using a stress ball, swelling has significantly improved but he does still have some persistent swelling.  He is mostly here today for suture removal.  Incision looks to be healing well and sutures are intact.  No evidence of infection or dehiscence.  Sutures were removed and replaced with Steri-Strips.  He will continue with his at home exercises working on passive and active flexion and extension and he was referred to occupational therapy to work on left wrist range of motion with no strengthening just yet.  He also complains of left foot/ankle pain as well as left shoulder pain that has been bothering him more recently in the last week.  He first noticed this pain after his fall that resulted in his left wrist fracture.  Most of his ankle pain he localizes to the lateral aspect of the ankle near the ATFL.  He does have tenderness over the ATFL on exam today with increased pain with stressing the ATFL.  No evidence of syndesmotic injury on exam.  Intact dorsiflexion, plantarflexion, inversion, eversion.  No tenderness over the medial or lateral malleoli or over the Achilles tendon.  Achilles  tendon and anterior tibialis tendon are palpable and intact.  Most of his pain is centered over the ATFL so plan to give patient sports ankle brace to wear for ankle support.  He did have a little bit of tenderness over the Lisfranc complex but radiographs taken today show there is no widening of the Lisfranc joint or disruption of the border between the medial border of the fourth metatarsal and the medial cuneiform.  Regarding his left shoulder, he has no history of left shoulder pain but he does have increased pain since a fall.  On exam he has 60 degrees external rotation, 120 degrees abduction, 20 degrees forward flexion.  Excellent rotator cuff strength of supra, infra, subscap.  No tenderness of the AC joint or any deformity to the [facility] joint.  He is able to actively move his shoulder with axillary nerve intact and deltoid firing.  Active range of motion equivalent to passive range of motion.  No significant neck pain or radicular pain.  Mild tenderness over the bicipital groove with positive O'Brien sign.  Overall, nothing compelling on shoulder exam for urgent evaluation.  Plan to watch this as patient improves from his left wrist surgery.  Follow-up in 2 weeks for clinical recheck with x-rays.  Follow-Up Instructions: No follow-ups on file.   Orders:  Orders Placed This Encounter  Procedures  XR Foot Complete Left   XR Shoulder Left   Ambulatory referral to Occupational Therapy   No orders of the defined types were placed in this encounter.   Imaging: No results found.  PMFS History: Patient Active Problem List   Diagnosis Date Noted   Closed fracture of lower end of left radius with routine healing    Premature ventricular contractions (PVCs) (VPCs) 03/10/2020   Past Medical History:  Diagnosis Date   Anemia    late teens , early 86's     Family History  Problem Relation Age of Onset   Stroke Mother    Stroke Father    Stroke Sister    Cancer Brother    Pancreatic cancer  Brother    Colon cancer Neg Hx    Colon polyps Neg Hx    Esophageal cancer Neg Hx    Rectal cancer Neg Hx    Stomach cancer Neg Hx     Past Surgical History:  Procedure Laterality Date   NO PAST SURGERIES     OPEN REDUCTION INTERNAL FIXATION (ORIF) DISTAL RADIAL FRACTURE Left 11/19/2020   Procedure: OPEN REDUCTION INTERNAL FIXATION (ORIF) DISTAL RADIUS FRACTURE;  Surgeon: Meredith Pel, MD;  Location: [place];  Service: Orthopedics;  Laterality: Left;   WISDOM TOOTH EXTRACTION     Social History   Occupational History   Not on file  Tobacco Use   Smoking status: Never   Smokeless tobacco: Never  Vaping Use   Vaping Use: Never used  Substance and Sexual Activity   Alcohol use: No   Drug use: No   Sexual activity: Not on file

## 2020-12-04 ENCOUNTER — Encounter: Payer: Self-pay | Admitting: Orthopedic Surgery

## 2020-12-13 ENCOUNTER — Other Ambulatory Visit: Payer: Self-pay

## 2020-12-13 ENCOUNTER — Ambulatory Visit: Payer: BC Managed Care – PPO | Attending: Surgical | Admitting: Occupational Therapy

## 2020-12-13 DIAGNOSIS — R600 Localized edema: Secondary | ICD-10-CM | POA: Diagnosis present

## 2020-12-13 DIAGNOSIS — M6281 Muscle weakness (generalized): Secondary | ICD-10-CM | POA: Insufficient documentation

## 2020-12-13 DIAGNOSIS — M25532 Pain in left wrist: Secondary | ICD-10-CM | POA: Diagnosis present

## 2020-12-13 DIAGNOSIS — M25649 Stiffness of unspecified hand, not elsewhere classified: Secondary | ICD-10-CM | POA: Insufficient documentation

## 2020-12-13 DIAGNOSIS — M25632 Stiffness of left wrist, not elsewhere classified: Secondary | ICD-10-CM | POA: Diagnosis present

## 2020-12-13 NOTE — Therapy (Signed)
[facility] [address] [place], [place], [address] Phone: [phone]   Fax:  [phone]  Occupational Therapy Evaluation  Patient Details  Name: Donovan Persley MRN: [id number] Date of Birth: 09/10/1970 Referring Provider (OT): Gloriann Loan PA-C   Encounter Date: 12/13/2020   OT End of Session - 12/13/20 0923     Visit Number 1    Number of Visits 8    Date for OT Re-Evaluation 02/13/21    Authorization Type BC/BS    OT Start Time 0800    OT Stop Time 0845    OT Time Calculation (min) 45 min    Activity Tolerance Patient tolerated treatment well    Behavior During Therapy [facility] for tasks assessed/performed   Slightly anxious about injury            Past Medical History:  Diagnosis Date   Anemia    late teens , early 20's     Past Surgical History:  Procedure Laterality Date   NO PAST SURGERIES     OPEN REDUCTION INTERNAL FIXATION (ORIF) DISTAL RADIAL FRACTURE Left 11/19/2020   Procedure: OPEN REDUCTION INTERNAL FIXATION (ORIF) DISTAL RADIUS FRACTURE;  Surgeon: Meredith Pel, MD;  Location: [facility];  Service: Orthopedics;  Laterality: Left;   WISDOM TOOTH EXTRACTION      There were no vitals filed for this visit.   Subjective Assessment - 12/13/20 0800     Pertinent History s/p ORIF Lt distal radius fx 11/19/20    Patient Stated Goals Improve wrist/hand function and motion    Currently in Pain? Yes    Pain Score 4     Pain Location --   thumb, fingers, and less so wrist   Pain Orientation Left    Pain Descriptors / Indicators Aching    Pain Type Acute pain    Pain Onset 1 to 4 weeks ago    Pain Frequency Constant    Aggravating Factors  accidentally hitting against it    Pain Relieving Factors rest               [facility] OT Assessment - 12/13/20 0001       Assessment   Medical Diagnosis s/p ORIF Lt distal radius fx    Referring Provider (OT) Magnant, Charles PA-C    Onset  Date/Surgical Date 11/19/20    Hand Dominance Right    Next MD Visit Next Monday    Prior Therapy none      Precautions   Precaution Comments no strengthening at wrist (ok for hand strengthening per MD clearance to squeeze ball)    Required Braces or Orthoses Other Brace/Splint    Other Brace/Splint pre-fab wrist brace      Restrictions   Weight Bearing Restrictions Yes    LUE Weight Bearing Non weight bearing   through hand/wrist     Balance Screen   Has the patient fallen in the past 6 months Yes    How many times? 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    Leisure race track, electronics      ADL   Eating/Feeding Modified independent    Grooming Modified independent    Upper Body Bathing Modified independent  Lower Body Bathing Modified independent    Upper Body Dressing Independent    Lower Body Dressing Independent    Toilet Transfer Independent    [place] Independent    Tub/Shower Transfer Independent      IADL   Shopping Shops independently for small purchases   wife does bulk shopping   Light Housekeeping --   light cleaning, wife does mostly   Meal Prep Able to complete simple cold meal and snack prep;Able to complete simple warm meal prep   wife does mostly   Programmer, applications own vehicle    Medication Management Is responsible for taking medication in correct dosages at correct time      Mobility   Mobility Status Comments independent      Written Expression   Dominant Hand Right    Handwriting --   no changes     Observation/Other Assessments   Observations Arrived with pre-fab brace. Pt has full finger ROM in brace. Pt reports MD approved hand strengthening with squeeze ball. No  strengthening or wt bearing at wrist      Sensation   Additional Comments pt reports sensation has resolved Lt hand      Coordination   Coordination WFL's      Edema   Edema mild Lt hand      ROM / Strength   AROM / PROM / Strength AROM      AROM   Overall AROM Comments LUE: shoulder ROM WFL's w/ some discomfort (improving), elbow and forearm ROM WFL's. Wrist flex = 45*, ext = 20*, RD = 15*, UD = 35*. Finger ROM WFL's. See below for thumb      Left Hand AROM   L Thumb MCP 0-60 25 Degrees    L Thumb Radial ADduction/ABduction 0-55 55    L Thumb Palmar ADduction/ABduction 0-45 65    L Thumb Opposition to Index --   to 5th digit     Hand Function   Right Hand Grip (lbs) 98.5 lbs    Left Hand Grip (lbs) 18.2 lbs                      OT Treatments/Exercises (OP) - 12/13/20 0001       ADLs   ADL Comments reviewed precautions (no weight bearing or wrist strengthening) and hygiene care      Exercises   Exercises --   Pt issued A/ROM HEP for wrist and thumb - see pt instructions for details. Pt instructed to continue forearm pronation/supination d/t tight at end ranges and hand strengthening with squeeze ball (as cleared by MD)                  OT Education - 12/13/20 0841     Education Details A/ROM HEP for wrist and thumb, review of precautions and hygiene care (including steri-strip care)    Person(s) Educated Patient    Methods Explanation;Demonstration;Handout    Comprehension Verbalized understanding;Returned demonstration              OT Short Term Goals - 12/13/20 0931       OT SHORT TERM GOAL #1   Title Independent with ROM HEP    Time 4    Period Weeks    Status On-going      OT SHORT TERM GOAL #2   Title Independent with scar massage (once steri-strips fall off) and edema management  strategies    Time 4    Period Weeks    Status New      OT SHORT TERM GOAL #3   Title Pt to improve Lt thumb MP flexion to 40* and radial  abduction to 65*    Baseline 25*, 55*    Time 4    Period Weeks    Status New      OT SHORT TERM GOAL #4   Title Pt to improve Lt wrist flexion to 50* and extension to 30* for functional tasks    Baseline 45*, 20*    Time 4    Period Weeks    Status New               OT Long Term Goals - 12/13/20 1012       OT LONG TERM GOAL #1   Title Independent with updated strengthening HEP    Time 8    Period Weeks    Status New      OT LONG TERM GOAL #2   Title Grip strength Lt hand to be 40 lbs or greater    Baseline 18 lbs (Rt = 98 lbs)    Time 8    Period Weeks    Status New      OT LONG TERM GOAL #3   Title Wrist and thumb ROM WFL's for all functional tasks    Time 8    Period Weeks    Status New      OT LONG TERM GOAL #4   Title Pt to return to using Lt hand as assist for all bilateral tasks and ADLS    Time 8    Period Weeks    Status New      OT LONG TERM GOAL #5   Title Pain less than or equal to 3/10 Lt hand/wrist with all ADLS    Time 8    Period Weeks    Status New                   Plan - 12/13/20 0925     Clinical Impression Statement Pt is a 50 y.o. male who presents to OPOT s/p ORIF Lt distal radius fx on 11/19/20. Pt arrived with well fitted pre-fab wrist brace (provided by MD office) which allows full finger ROM. Pt has steri-strips. Pt presents with decreased wrist ROM and thumb flexion and radial abduction, mild pain and swelling Lt hand/wrist, and overall decreased functional use and strength as to be expected at this point in recovery. Anticipate pt will do well based on today's findings. Pt will nevertheless benefit from continued O.T. to address current deficits and progress pt as able to full use of LUE. Pt also reports some shoulder pain from fall but has improved and ROM at shoulder WFL'S. No significant PMH    OT Occupational Profile and History Problem Focused Assessment - Including review of records relating to presenting problem     Occupational performance deficits (Please refer to evaluation for details): ADL's;IADL's;Work;Social Participation    Body Structure / Function / Physical Skills ADL;Strength;GMC;Pain;Edema;UE functional use;IADL;ROM;Scar mobility;Coordination;Sensation;Decreased knowledge of precautions;FMC    Rehab Potential Excellent    Clinical Decision Making Limited treatment options, no task modification necessary    Comorbidities Affecting Occupational Performance: None    Modification or Assistance to Complete Evaluation  No modification of tasks or assist necessary to complete eval    OT Frequency 1x / week    OT Duration 8  weeks    OT Treatment/Interventions Self-care/ADL training;Moist Heat;Fluidtherapy;DME and/or AE instruction;Splinting;Contrast Bath;Compression bandaging;Therapeutic activities;Ultrasound;Therapeutic exercise;Scar mobilization;Cryotherapy;Passive range of motion;Electrical Stimulation;Paraffin;Manual Therapy;Patient/family education    Plan review A/ROM HEP, add gentle P/ROM, fluidotherapy    Consulted and Agree with Plan of Care Patient             Patient will benefit from skilled therapeutic intervention in order to improve the following deficits and impairments:   Body Structure / Function / Physical Skills: ADL, Strength, GMC, Pain, Edema, UE functional use, IADL, ROM, Scar mobility, Coordination, Sensation, Decreased knowledge of precautions, [facility]       Visit Diagnosis: Stiffness of left wrist, not elsewhere classified  Stiffness of thumb joint  Pain in left wrist  Muscle weakness (generalized)  Localized edema    Problem List Patient Active Problem List   Diagnosis Date Noted   Closed fracture of lower end of left radius with routine healing    Premature ventricular contractions (PVCs) (VPCs) 03/10/2020    Carey Bullocks, OTR/L 12/13/2020, 12:40 PM  [facility] [address] [place], [place], [address] Phone: [phone]   Fax:  [phone]  Name: Vickey Ewbank MRN: [id number] Date of Birth: 1970-12-02

## 2020-12-13 NOTE — Patient Instructions (Signed)
AROM: Wrist Extension    With left palm down, bend wrist up. Repeat __10__ times per set. Do __1__ sets per session. Do _4___ sessions per day. Do 1/2 with fingers open, and do 1/2 with fingers closed  Wrist Flexion    Forearm resting on thigh (or other surface), palm up, no weight. Raise hand up. Hold momentarily. Return slowly. Repeat _10__ times each hand. Do 4 sessions per day   AROM: Wrist Radial / Ulnar Deviation    Gently bend left wrist from side to side as far as possible. Repeat __10__ times per set. Do __1__ sets per session. Do __4__ sessions per day.   **Do thumb ex's moving from a "4" to a "5" position with thumb x 10 reps each. Do 4 sessions per day

## 2020-12-15 ENCOUNTER — Encounter: Payer: BC Managed Care – PPO | Admitting: Occupational Therapy

## 2020-12-20 ENCOUNTER — Other Ambulatory Visit: Payer: Self-pay

## 2020-12-20 ENCOUNTER — Ambulatory Visit (INDEPENDENT_AMBULATORY_CARE_PROVIDER_SITE_OTHER): Payer: BC Managed Care – PPO | Admitting: Orthopedic Surgery

## 2020-12-20 ENCOUNTER — Ambulatory Visit: Payer: Self-pay

## 2020-12-20 DIAGNOSIS — S52572A Other intraarticular fracture of lower end of left radius, initial encounter for closed fracture: Secondary | ICD-10-CM

## 2020-12-23 ENCOUNTER — Encounter: Payer: Self-pay | Admitting: Orthopedic Surgery

## 2020-12-23 ENCOUNTER — Other Ambulatory Visit: Payer: Self-pay

## 2020-12-23 ENCOUNTER — Ambulatory Visit: Payer: BC Managed Care – PPO | Admitting: Occupational Therapy

## 2020-12-23 DIAGNOSIS — M6281 Muscle weakness (generalized): Secondary | ICD-10-CM

## 2020-12-23 DIAGNOSIS — M25532 Pain in left wrist: Secondary | ICD-10-CM

## 2020-12-23 DIAGNOSIS — R600 Localized edema: Secondary | ICD-10-CM

## 2020-12-23 DIAGNOSIS — M25632 Stiffness of left wrist, not elsewhere classified: Secondary | ICD-10-CM

## 2020-12-23 DIAGNOSIS — M25649 Stiffness of unspecified hand, not elsewhere classified: Secondary | ICD-10-CM

## 2020-12-23 NOTE — Progress Notes (Signed)
Post-Op Visit Note   Patient: Antonio Moore           Date of Birth: 26-Oct-1970           MRN: [id number] Visit Date: 12/20/2020 PCP: Horald Pollen, MD   Assessment & Plan:  Chief Complaint:  Chief Complaint  Patient presents with   Left Wrist - Routine Post Op    11/19/20 ORIF left wrist   Visit Diagnoses:  1. Other closed intra-articular fracture of distal end of left radius, initial encounter     Plan: See above  Follow-Up Instructions: Return in about 3 weeks (around 01/10/2021).   Orders:  Orders Placed This Encounter  Procedures   XR Wrist 2 Views Left   No orders of the defined types were placed in this encounter.   Imaging: No results found.  PMFS History: Patient Active Problem List   Diagnosis Date Noted   Closed fracture of lower end of left radius with routine healing    Premature ventricular contractions (PVCs) (VPCs) 03/10/2020   Past Medical History:  Diagnosis Date   Anemia    late teens , early 40's     Family History  Problem Relation Age of Onset   Stroke Mother    Stroke Father    Stroke Sister    Cancer Brother    Pancreatic cancer Brother    Colon cancer Neg Hx    Colon polyps Neg Hx    Esophageal cancer Neg Hx    Rectal cancer Neg Hx    Stomach cancer Neg Hx     Past Surgical History:  Procedure Laterality Date   NO PAST SURGERIES     OPEN REDUCTION INTERNAL FIXATION (ORIF) DISTAL RADIAL FRACTURE Left 11/19/2020   Procedure: OPEN REDUCTION INTERNAL FIXATION (ORIF) DISTAL RADIUS FRACTURE;  Surgeon: Meredith Pel, MD;  Location: [facility];  Service: Orthopedics;  Laterality: Left;   WISDOM TOOTH EXTRACTION     Social History   Occupational History   Not on file  Tobacco Use   Smoking status: Never   Smokeless tobacco: Never  Vaping Use   Vaping Use: Never used  Substance and Sexual Activity   Alcohol use: No   Drug use: No   Sexual activity: Not on file      Post-Op Visit Note   Patient: Antonio Moore           Date of Birth: 1971/03/08           MRN: [id number] Visit Date: 12/20/2020 PCP: Horald Pollen, MD   Assessment & Plan:  Chief Complaint:  Chief Complaint  Patient presents with   Left Wrist - Routine Post Op    11/19/20 ORIF left wrist   Visit Diagnoses:  1. Other closed intra-articular fracture of distal end of left radius, initial encounter     Plan: [name] is a patient who is now about a month out left wrist open reduction internal fixation.  Improving.  In wrist splint.  On exam is got good grip strength and full pronation supination.  Has about half of his flexion extension arc.  No medication for pain.  Grip strength improving.  Radiographs look good.  Discontinue splint.  No lifting more than a pound with that left wrist.  Continue therapy for wrist range of motion.  3-week return for final check.  Anticipate return to weightlifting potentially around the 3 to 23-monthmark once he has more range of motion.  Follow-Up Instructions:  No follow-ups on file.   Orders:  Orders Placed This Encounter  Procedures   XR Wrist 2 Views Left   No orders of the defined types were placed in this encounter.   Imaging: No results found.  PMFS History: Patient Active Problem List   Diagnosis Date Noted   Closed fracture of lower end of left radius with routine healing    Premature ventricular contractions (PVCs) (VPCs) 03/10/2020   Past Medical History:  Diagnosis Date   Anemia    late teens , early 20's     Family History  Problem Relation Age of Onset   Stroke Mother    Stroke Father    Stroke Sister    Cancer Brother    Pancreatic cancer Brother    Colon cancer Neg Hx    Colon polyps Neg Hx    Esophageal cancer Neg Hx    Rectal cancer Neg Hx    Stomach cancer Neg Hx     Past Surgical History:  Procedure Laterality Date   NO PAST SURGERIES     OPEN REDUCTION INTERNAL FIXATION (ORIF) DISTAL RADIAL FRACTURE Left 11/19/2020   Procedure:  OPEN REDUCTION INTERNAL FIXATION (ORIF) DISTAL RADIUS FRACTURE;  Surgeon: Meredith Pel, MD;  Location: [facility];  Service: Orthopedics;  Laterality: Left;   WISDOM TOOTH EXTRACTION     Social History   Occupational History   Not on file  Tobacco Use   Smoking status: Never   Smokeless tobacco: Never  Vaping Use   Vaping Use: Never used  Substance and Sexual Activity   Alcohol use: No   Drug use: No   Sexual activity: Not on file

## 2020-12-23 NOTE — Patient Instructions (Signed)
Flexor Tendon Gliding (Active Hook Fist)   With fingers and knuckles straight, bend middle and tip joints. Do not bend large knuckles.  Flexor Tendon Gliding (Active Straight Fist)   Start with fingers straight. Bend knuckles and middle joints. Keep fingertip joints straight to touch base of palm.  Flexor Tendon Gliding (Active Full Fist)   Straighten all fingers, then make a fist, bending all joints. Repeat 10-15 times. Do 2-3 sessions per day.   PROM: Wrist Flexion / Extension   Grasp left hand and slowly/gently bend wrist until stretch is felt. Relax. Then stretch in opposite direction. Be sure to keep elbow bent. Repeat 10 times per set. Do 1-2 sets per session. Do 2-3 sessions per day.  Copyright  VHI. All rights reserved.

## 2020-12-24 NOTE — Therapy (Signed)
[facility] [address] [place], [place], [address] Phone: [phone]   Fax:  [phone]  Occupational Therapy Treatment  Patient Details  Name: Antonio Moore MRN: [id number] Date of Birth: [birth year] Referring Provider (OT): Gloriann Loan PA-C   Encounter Date: 12/23/2020   OT End of Session - 12/23/20 1018     Visit Number 2    Number of Visits 8    Date for OT Re-Evaluation 02/13/21    Authorization Type BC/BS    OT Start Time 1017    OT Stop Time 1100    OT Time Calculation (min) 43 min    Activity Tolerance Patient tolerated treatment well    Behavior During Therapy [facility] for tasks assessed/performed   Slightly anxious about injury            Past Medical History:  Diagnosis Date   Anemia    late teens , early 20's     Past Surgical History:  Procedure Laterality Date   NO PAST SURGERIES     OPEN REDUCTION INTERNAL FIXATION (ORIF) DISTAL RADIAL FRACTURE Left 11/19/2020   Procedure: OPEN REDUCTION INTERNAL FIXATION (ORIF) DISTAL RADIUS FRACTURE;  Surgeon: Meredith Pel, MD;  Location: [facility];  Service: Orthopedics;  Laterality: Left;   WISDOM TOOTH EXTRACTION      There were no vitals filed for this visit.   Subjective Assessment - 12/23/20 1018     Subjective  Pt states he had a follow-up appt w/ Dr. Marlou Sa on Monday (7/25); d/c splint and continues w/ 1# lifting restriction for L hand    Pertinent History s/p ORIF Lt distal radius fx 11/19/20    Patient Stated Goals Improve wrist/hand function and motion    Currently in Pain? Yes    Pain Score 2     Pain Location Hand    Pain Orientation Left    Pain Descriptors / Indicators Aching;Dull    Pain Onset 1 to 4 weeks ago    Aggravating Factors  Reports occasional "twinge"             Treatment/Exercises - 12/23/20    AROM Wrist flex, ext, and ulnar/rad deviation against gravity w/ arm on elevation wedge; 10x each  Forearm  sup/pro completed 10x each w/ no difficulty and good ROM  Thumb MP flexion 10x w/ verbal cues for full radial abd each rep  Tendon glides 10x w/ modeling and occ verbal cues for pacing and gentle movements    PROM Gentle self-PROM of wrist flex and ext completed w/ forearm in neutral on arm elevation wedge; OT provided modeling, verbal cues, and education w/ pt verbalizing understanding            OT Education - 12/23/20 1015     Education Details Condition-specific education provided. Reviewed A/ROM HEP for wrist and thumb; introduced gentle P/ROM for wrist, tendon glides, and scar massage (proximal 1/2 of scar only at this time to allow for continued healing of distal 1/2 of scar) -- see pt instructions    Person(s) Educated Patient    Methods Explanation;Demonstration;Handout    Comprehension Verbalized understanding;Returned demonstration             OT Short Term Goals - 12/23/20 1019       OT SHORT TERM GOAL #1   Title Independent with ROM HEP    Time 4    Period Weeks    Status Achieved   12/23/20 - returned demo of HEP  Ind     OT SHORT TERM GOAL #2   Title Independent with scar massage (once steri-strips fall off) and edema management strategies    Time 4    Period Weeks    Status On-going      OT SHORT TERM GOAL #3   Title Pt to improve Lt thumb MP flexion to 40* and radial abduction to 65*    Baseline MP flex 25*, rad abd 55*    Time 4    Period Weeks    Status Partially Met   12/23/20 - MP flex 45*, rad abd 55*     OT SHORT TERM GOAL #4   Title Pt to improve Lt wrist flexion to 50* and extension to 30* for functional tasks    Baseline flex 45*, ext 20*    Time 4    Period Weeks    Status On-going   12/23/20 - flex 35*, ext 25*            OT Long Term Goals - 12/13/20 1012       OT LONG TERM GOAL #1   Title Independent with updated strengthening HEP    Time 8    Period Weeks    Status New      OT LONG TERM GOAL #2   Title Grip strength Lt  hand to be 40 lbs or greater    Baseline 18 lbs (Rt = 98 lbs)    Time 8    Period Weeks    Status New      OT LONG TERM GOAL #3   Title Wrist and thumb ROM WFL's for all functional tasks    Time 8    Period Weeks    Status New      OT LONG TERM GOAL #4   Title Pt to return to using Lt hand as assist for all bilateral tasks and ADLS    Time 8    Period Weeks    Status New      OT LONG TERM GOAL #5   Title Pain less than or equal to 3/10 Lt hand/wrist with all ADLS    Time 8    Period Weeks    Status New             Plan - 12/23/20 1023     Clinical Impression Statement Pt is 5 weeks s/p ORIF Lt distal radius fx and was d/c from splint on Monday (6/25) w/ continued 1# lifting restriction, per MD. Pt continues to experience mild pain and swelling in L hand/wrist, but has shown improvement in thumb MP flex and wrist ext, as well as compliance w/ HEP for AROM. Wrist flex/ext self-PROM initiated this session w/ education provided on benefit of not "forcing" movement/pushing past point of pain.    OT Occupational Profile and History Problem Focused Assessment - Including review of records relating to presenting problem    Occupational performance deficits (Please refer to evaluation for details): ADL's;IADL's;Work;Social Participation    Body Structure / Function / Physical Skills ADL;Strength;GMC;Pain;Edema;UE functional use;IADL;ROM;Scar mobility;Coordination;Sensation;Decreased knowledge of precautions;FMC    Rehab Potential Excellent    Clinical Decision Making Limited treatment options, no task modification necessary    Comorbidities Affecting Occupational Performance: None    Modification or Assistance to Complete Evaluation  No modification of tasks or assist necessary to complete eval    OT Frequency 1x / week    OT Duration 8 weeks    OT Treatment/Interventions Self-care/ADL training;Moist Heat;Fluidtherapy;DME  and/or AE instruction;Splinting;Contrast Bath;Compression  bandaging;Therapeutic activities;Ultrasound;Therapeutic exercise;Scar mobilization;Cryotherapy;Passive range of motion;Electrical Stimulation;Paraffin;Manual Therapy;Patient/family education    Plan Fluidotherapy; discuss progressing scar massage to include distal 1/2 of scar as healing allows; continue gentle PROM, review PROM HEP, include rad/ulnar deviation?    Consulted and Agree with Plan of Care Patient             Patient will benefit from skilled therapeutic intervention in order to improve the following deficits and impairments:   Body Structure / Function / Physical Skills: ADL, Strength, GMC, Pain, Edema, UE functional use, IADL, ROM, Scar mobility, Coordination, Sensation, Decreased knowledge of precautions, [facility]   Visit Diagnosis: Stiffness of left wrist, not elsewhere classified  Stiffness of thumb joint  Pain in left wrist  Muscle weakness (generalized)  Localized edema    Problem List Patient Active Problem List   Diagnosis Date Noted   Closed fracture of lower end of left radius with routine healing    Premature ventricular contractions (PVCs) (VPCs) 03/10/2020     Kathrine Cords, OTR/L, MSOT  12/24/2020, 10:35 AM  [facility] [address] [place], [place], [address] Phone: [phone]   Fax:  [phone]  Name: Cardarius Senat MRN: [id number] Date of Birth: September 13, 1970

## 2020-12-28 ENCOUNTER — Encounter: Payer: Self-pay | Admitting: Emergency Medicine

## 2020-12-29 ENCOUNTER — Ambulatory Visit: Payer: Self-pay | Admitting: Occupational Therapy

## 2020-12-29 NOTE — Telephone Encounter (Signed)
Thank you.  Nice response on your part.  Thanks again.

## 2021-01-03 ENCOUNTER — Ambulatory Visit: Payer: Self-pay | Admitting: Occupational Therapy

## 2021-01-05 ENCOUNTER — Encounter: Payer: Self-pay | Admitting: Emergency Medicine

## 2021-01-10 ENCOUNTER — Other Ambulatory Visit: Payer: Self-pay

## 2021-01-10 ENCOUNTER — Ambulatory Visit (INDEPENDENT_AMBULATORY_CARE_PROVIDER_SITE_OTHER): Payer: Self-pay | Admitting: Orthopedic Surgery

## 2021-01-10 DIAGNOSIS — S52572A Other intraarticular fracture of lower end of left radius, initial encounter for closed fracture: Secondary | ICD-10-CM

## 2021-01-13 ENCOUNTER — Ambulatory Visit: Payer: Self-pay | Attending: Surgical | Admitting: Occupational Therapy

## 2021-01-13 ENCOUNTER — Other Ambulatory Visit: Payer: Self-pay

## 2021-01-13 DIAGNOSIS — M25532 Pain in left wrist: Secondary | ICD-10-CM | POA: Insufficient documentation

## 2021-01-13 DIAGNOSIS — M25649 Stiffness of unspecified hand, not elsewhere classified: Secondary | ICD-10-CM | POA: Insufficient documentation

## 2021-01-13 DIAGNOSIS — M6281 Muscle weakness (generalized): Secondary | ICD-10-CM | POA: Insufficient documentation

## 2021-01-13 DIAGNOSIS — M25632 Stiffness of left wrist, not elsewhere classified: Secondary | ICD-10-CM | POA: Insufficient documentation

## 2021-01-13 NOTE — Therapy (Signed)
[facility] [address] [place], [place], [address] Phone: [phone]   Fax:  [phone]  Occupational Therapy Treatment  Patient Details  Name: Joal Eakle MRN: [id number] Date of Birth: 08-09-1970 Referring Provider (OT): Gloriann Loan PA-C   Encounter Date: 01/13/2021   OT End of Session - 01/13/21 1012     Visit Number 3    Number of Visits 8    Date for OT Re-Evaluation 02/13/21    Authorization Type BC/BS    OT Start Time 0930    OT Stop Time 1010    OT Time Calculation (min) 40 min    Activity Tolerance Patient tolerated treatment well    Behavior During Therapy [facility] for tasks assessed/performed   Slightly anxious about injury            Past Medical History:  Diagnosis Date   Anemia    late teens , early 20's     Past Surgical History:  Procedure Laterality Date   NO PAST SURGERIES     OPEN REDUCTION INTERNAL FIXATION (ORIF) DISTAL RADIAL FRACTURE Left 11/19/2020   Procedure: OPEN REDUCTION INTERNAL FIXATION (ORIF) DISTAL RADIUS FRACTURE;  Surgeon: Meredith Pel, MD;  Location: [facility];  Service: Orthopedics;  Laterality: Left;   WISDOM TOOTH EXTRACTION      There were no vitals filed for this visit.   Subjective Assessment - 01/13/21 0933     Subjective  It's doing a lot better    Pertinent History s/p ORIF Lt distal radius fx 11/19/20    Patient Stated Goals Improve wrist/hand function and motion    Currently in Pain? Yes    Pain Score 1     Pain Location Wrist    Pain Orientation Left    Pain Descriptors / Indicators Aching;Dull    Pain Type Acute pain    Pain Onset 1 to 4 weeks ago    Pain Frequency Intermittent    Aggravating Factors  reports occasional "twinge"    Pain Relieving Factors rest             Fluidotherapy x 12 min at beginning of session to decrease stiffness and for desensitization.   Pt almost 8 weeks post op.  Thumb radial abd = 75*, wrist  flex = 55*, ext = 42*  Upgraded HEP - see below and pt instructions for details                     OT Education - 01/13/21 1004     Education Details more aggresive P/ROM HEP, light wrist and putty strengthening HEP    Person(s) Educated Patient    Methods Explanation;Demonstration;Handout    Comprehension Verbalized understanding;Returned demonstration              OT Short Term Goals - 01/13/21 1012       OT SHORT TERM GOAL #1   Title Independent with ROM HEP    Time 4    Period Weeks    Status Achieved   12/23/20 - returned demo of [place] #2   Title Independent with scar massage (once steri-strips fall off) and edema management strategies    Time 4    Period Weeks    Status Achieved      OT SHORT TERM GOAL #3   Title Pt to improve Lt thumb MP flexion to 40* and radial abduction to 65*  Baseline MP flex 25*, rad abd 55*    Time 4    Period Weeks    Status Achieved   12/23/20 - MP flex 45*, rad abd 55*, 01/13/21: rad abd 75*     OT SHORT TERM GOAL #4   Title Pt to improve Lt wrist flexion to 50* and extension to 30* for functional tasks    Baseline flex 45*, ext 20*    Time 4    Period Weeks    Status Achieved   12/23/20 - flex 35*, ext 25*; 01/13/21: flex = 55*, ext = 42*              OT Long Term Goals - 01/13/21 1013       OT LONG TERM GOAL #1   Title Independent with updated strengthening HEP    Time 8    Period Weeks    Status On-going   initiated 01/13/21     OT LONG TERM GOAL #2   Title Grip strength Lt hand to be 40 lbs or greater    Baseline 18 lbs (Rt = 98 lbs)    Time 8    Period Weeks    Status On-going   issued red putty 01/13/21     OT LONG TERM GOAL #3   Title Wrist and thumb ROM WFL's for all functional tasks    Time 8    Period Weeks    Status On-going      OT LONG TERM GOAL #4   Title Pt to return to using Lt hand as assist for all bilateral tasks and ADLS    Time 8    Period Weeks     Status New      OT LONG TERM GOAL #5   Title Pain less than or equal to 3/10 Lt hand/wrist with all ADLS    Time 8    Period Weeks    Status Achieved                   Plan - 01/13/21 1014     Clinical Impression Statement Pt now is 1 day shy of 8 weeks post-op. Pt has met all STG's and 1 LTG and progressing towards remaining LTG's. Pt has improved in wrist and thumb ROM and began light strengthening today w/ 1 lb weight for wrist and putty for hand.    OT Occupational Profile and History Problem Focused Assessment - Including review of records relating to presenting problem    Occupational performance deficits (Please refer to evaluation for details): ADL's;IADL's;Work;Social Participation    Body Structure / Function / Physical Skills ADL;Strength;GMC;Pain;Edema;UE functional use;IADL;ROM;Scar mobility;Coordination;Sensation;Decreased knowledge of precautions;FMC    Rehab Potential Excellent    Clinical Decision Making Limited treatment options, no task modification necessary    Comorbidities Affecting Occupational Performance: None    Modification or Assistance to Complete Evaluation  No modification of tasks or assist necessary to complete eval    OT Frequency 1x / week    OT Duration 8 weeks    OT Treatment/Interventions Self-care/ADL training;Moist Heat;Fluidtherapy;DME and/or AE instruction;Splinting;Contrast Bath;Compression bandaging;Therapeutic activities;Ultrasound;Therapeutic exercise;Scar mobilization;Cryotherapy;Passive range of motion;Electrical Stimulation;Paraffin;Manual Therapy;Patient/family education    Plan Pt instructed to increase to 2-3 lbs next week, Pt will return 01/24/21 to update to green putty and increase wrist strengthening. Possibly will need to schedule 1-2 more visits or d/c at this time    Consulted and Agree with Plan of Care Patient               Patient will benefit from skilled therapeutic intervention in order to improve the following  deficits and impairments:   Body Structure / Function / Physical Skills: ADL, Strength, GMC, Pain, Edema, UE functional use, IADL, ROM, Scar mobility, Coordination, Sensation, Decreased knowledge of precautions, [facility]       Visit Diagnosis: Stiffness of left wrist, not elsewhere classified  Stiffness of thumb joint  Pain in left wrist  Muscle weakness (generalized)    Problem List Patient Active Problem List   Diagnosis Date Noted   Closed fracture of lower end of left radius with routine healing    Premature ventricular contractions (PVCs) (VPCs) 03/10/2020    Carey Bullocks, OTR/L 01/13/2021, 10:29 AM  [facility] [address] [place], [place], [address] Phone: [phone]   Fax:  [phone]  Name: Esker Dever MRN: [id number] Date of Birth: 06-29-1970

## 2021-01-13 NOTE — Patient Instructions (Signed)
Composite Extension (Passive Flexor Stretch)    Sitting with elbows on table and palms together, slowly lower wrists toward table until stretch is felt. Be sure to keep palms together throughout stretch. Hold __10__ seconds. Relax. Repeat __5__ times. Do __4__ sessions per day.  Flexion (Passive)    With elbow STRAIGHT out in front of you, let wrist drop down. Apply gentle down-ward push with fingers of other hand. Hold __10__ seconds. Repeat __5__ times. Do __4__ sessions per day.   Wrist Extension: Resisted    With left palm down, __1__ pound weight in hand, bend wrist up. Return slowly. Repeat _10___ times per set. Do __2__ sessions per day. Increase to 2-3 lbs in 1 week  Wrist Flexion: Resisted    With left palm up, __1__ pound weight in hand, bend wrist up. Return slowly. Repeat __10__ times per set.  Do __2__ sessions per day. Increase to 2-3 lbs in 1 week.     Wrist Radial Deviation: Resisted    With left thumb up, __1__ pound weight in hand, bend wrist up. Return slowly. Repeat __10__ times per set.  Do __2__ sessions per day. Increase to 2-3 lbs in 1 week.      1. Grip Strengthening (Resistive Putty)   Squeeze putty using thumb and all fingers. Repeat _20___ times. Do __2__ sessions per day.   2. Roll putty into tube on table and pinch between first two fingers and thumb x 10 reps. Do 2 sessions per day.

## 2021-01-14 ENCOUNTER — Encounter: Payer: Self-pay | Admitting: Orthopedic Surgery

## 2021-01-14 NOTE — Progress Notes (Signed)
   Post-Op Visit Note   Patient: Antonio Moore           Date of Birth: 1970-09-24           MRN: [id number] Visit Date: 01/10/2021 PCP: Horald Pollen, MD   Assessment & Plan:  Chief Complaint:  Chief Complaint  Patient presents with   Left Wrist - Routine Post Op    11/19/20 left wrist ORIF   Visit Diagnoses:  1. Other closed intra-articular fracture of distal end of left radius, initial encounter     Plan: [name] is a patient who is now about 6 weeks out left wrist open reduction internal fixation.  On exam he is got improving grip strength.  Still is lacking a little dorsiflexion range of motion.  Has about 70 on the right and 40 on the left.  Flexion is improving.  Pronation supination intact.  Plan at this time is continue therapy for range of motion of the wrist.  I want him to really focus more on range of motion as opposed to strengthening.  Follow-up in 6 weeks for final check.  Follow-Up Instructions: Return in about 6 weeks (around 02/21/2021).   Orders:  No orders of the defined types were placed in this encounter.  No orders of the defined types were placed in this encounter.   Imaging: No results found.  PMFS History: Patient Active Problem List   Diagnosis Date Noted   Closed fracture of lower end of left radius with routine healing    Premature ventricular contractions (PVCs) (VPCs) 03/10/2020   Past Medical History:  Diagnosis Date   Anemia    late teens , early 32's     Family History  Problem Relation Age of Onset   Stroke Mother    Stroke Father    Stroke Sister    Cancer Brother    Pancreatic cancer Brother    Colon cancer Neg Hx    Colon polyps Neg Hx    Esophageal cancer Neg Hx    Rectal cancer Neg Hx    Stomach cancer Neg Hx     Past Surgical History:  Procedure Laterality Date   NO PAST SURGERIES     OPEN REDUCTION INTERNAL FIXATION (ORIF) DISTAL RADIAL FRACTURE Left 11/19/2020   Procedure: OPEN REDUCTION INTERNAL  FIXATION (ORIF) DISTAL RADIUS FRACTURE;  Surgeon: Meredith Pel, MD;  Location: [facility];  Service: Orthopedics;  Laterality: Left;   WISDOM TOOTH EXTRACTION     Social History   Occupational History   Not on file  Tobacco Use   Smoking status: Never   Smokeless tobacco: Never  Vaping Use   Vaping Use: Never used  Substance and Sexual Activity   Alcohol use: No   Drug use: No   Sexual activity: Not on file

## 2021-01-17 ENCOUNTER — Encounter: Payer: BC Managed Care – PPO | Admitting: Occupational Therapy

## 2021-01-24 ENCOUNTER — Other Ambulatory Visit: Payer: Self-pay

## 2021-01-24 ENCOUNTER — Ambulatory Visit: Payer: Self-pay | Admitting: Occupational Therapy

## 2021-01-24 DIAGNOSIS — M25632 Stiffness of left wrist, not elsewhere classified: Secondary | ICD-10-CM

## 2021-01-24 DIAGNOSIS — M6281 Muscle weakness (generalized): Secondary | ICD-10-CM

## 2021-01-24 NOTE — Telephone Encounter (Signed)
Thanks

## 2021-01-24 NOTE — Therapy (Signed)
[address] [place], [place], [address] Phone: [phone]   Fax:  [phone]  Occupational Therapy Treatment  Patient Details  Name: Antonio Moore MRN: [id number] Date of Birth: 15-Jun-1970 Referring Provider (OT): Gloriann Loan PA-C   Encounter Date: 01/24/2021   OT End of Session - 01/24/21 0829     Visit Number 4    Number of Visits 8    Date for OT Re-Evaluation 02/13/21    Authorization Type BC/BS    OT Start Time 0802    OT Stop Time 0840    OT Time Calculation (min) 38 min    Activity Tolerance Patient tolerated treatment well    Behavior During Therapy [facility] for tasks assessed/performed   Slightly anxious about injury            Past Medical History:  Diagnosis Date   Anemia    late teens , early 20's     Past Surgical History:  Procedure Laterality Date   NO PAST SURGERIES     OPEN REDUCTION INTERNAL FIXATION (ORIF) DISTAL RADIAL FRACTURE Left 11/19/2020   Procedure: OPEN REDUCTION INTERNAL FIXATION (ORIF) DISTAL RADIUS FRACTURE;  Surgeon: Meredith Pel, MD;  Location: [facility];  Service: Orthopedics;  Laterality: Left;   WISDOM TOOTH EXTRACTION      There were no vitals filed for this visit.   Subjective Assessment - 01/24/21 0804     Subjective  Up to 3 lbs with wrist ex's with no problems    Pertinent History s/p ORIF Lt distal radius fx 11/19/20    Patient Stated Goals Improve wrist/hand function and motion    Currently in Pain? No/denies    Pain Onset 1 to 4 weeks ago             Assessed LTG's and progress to date - see below for details. Pt issued gel padding to wear at night to help flatten scar and educated in wear and care. Pt instructed to continue scar massage.   Reviewed strengthening HEP - pt performing wrist strengthening with 3 lb weight and upgraded to green putty for grip and pinch strengthening.   Gripper set at level 3 to pick up blocks for  sustained grip strength with 1 rest break, min difficulty                       OT Short Term Goals - 01/13/21 1012       OT SHORT TERM GOAL #1   Title Independent with ROM HEP    Time 4    Period Weeks    Status Achieved   12/23/20 - returned demo of Con[employer] #2   Title Independent with scar massage (once steri-strips fall off) and edema management strategies    Time 4    Period Weeks    Status Achieved      OT SHORT TERM GOAL #3   Title Pt to improve Lt thumb MP flexion to 40* and radial abduction to 65*    Baseline MP flex 25*, rad abd 55*    Time 4    Period Weeks    Status Achieved   12/23/20 - MP flex 45*, rad abd 55*, 01/13/21: rad abd 75*     OT SHORT TERM GOAL #4   Title Pt to improve Lt wrist flexion to 50* and extension to 30* for functional tasks  Baseline flex 45*, ext 20*    Time 4    Period Weeks    Status Achieved   12/23/20 - flex 35*, ext 25*; 01/13/21: flex = 55*, ext = 42*              OT Long Term Goals - 01/24/21 0819       OT LONG TERM GOAL #1   Title Independent with updated strengthening HEP    Time 8    Period Weeks    Status Achieved   initiated 01/13/21     OT LONG TERM GOAL #2   Title Grip strength Lt hand to be 40 lbs or greater    Baseline 18 lbs (Rt = 98 lbs)    Time 8    Period Weeks    Status Achieved   01/24/21: 52 lbs     OT LONG TERM GOAL #3   Title Wrist and thumb ROM WFL's for all functional tasks    Time 8    Period Weeks    Status Achieved   wrist flex = 65*, ext = 55*, thumb WFL's     OT LONG TERM GOAL #4   Title Pt to return to using Lt hand as assist for all bilateral tasks and ADLS    Time 8    Period Weeks    Status Achieved      OT LONG TERM GOAL #5   Title Pain less than or equal to 3/10 Lt hand/wrist with all ADLS    Time 8    Period Weeks    Status Achieved                   Plan - 01/24/21 0820     Clinical Impression Statement Pt is now 9.5  weeks post op. Pt has met all LTG's at this time. Pt has no concerns but instructed to wait on pull ups and push ups until he sees MD again to get clearance.    OT Occupational Profile and History Problem Focused Assessment - Including review of records relating to presenting problem    Occupational performance deficits (Please refer to evaluation for details): ADL's;IADL's;Work;Social Participation    Body Structure / Function / Physical Skills ADL;Strength;GMC;Pain;Edema;UE functional use;IADL;ROM;Scar mobility;Coordination;Sensation;Decreased knowledge of precautions;FMC    Rehab Potential Excellent    Clinical Decision Making Limited treatment options, no task modification necessary    Comorbidities Affecting Occupational Performance: None    Modification or Assistance to Complete Evaluation  No modification of tasks or assist necessary to complete eval    OT Frequency 1x / week    OT Duration 8 weeks    OT Treatment/Interventions Self-care/ADL training;Moist Heat;Fluidtherapy;DME and/or AE instruction;Splinting;Contrast Bath;Compression bandaging;Therapeutic activities;Ultrasound;Therapeutic exercise;Scar mobilization;Cryotherapy;Passive range of motion;Electrical Stimulation;Paraffin;Manual Therapy;Patient/family education    Plan D/C O.T.    Consulted and Agree with Plan of Care Patient             Patient will benefit from skilled therapeutic intervention in order to improve the following deficits and impairments:   Body Structure / Function / Physical Skills: ADL, Strength, GMC, Pain, Edema, UE functional use, IADL, ROM, Scar mobility, Coordination, Sensation, Decreased knowledge of precautions, [place]       Visit Diagnosis: Stiffness of left wrist, not elsewhere classified  Muscle weakness (generalized)    Problem List Patient Active Problem List   Diagnosis Date Noted   Closed fracture of lower end of left radius with routine healing    Premature ventricular  contractions  (PVCs) (VPCs) 03/10/2020    OCCUPATIONAL THERAPY DISCHARGE SUMMARY  Visits from Start of Care: 4  Current functional level related to goals / functional outcomes: Pt met all goals - see above   Remaining deficits: Strength    Education / Equipment: HEP's, scar massage   Patient agrees to discharge. Patient goals were met. Patient is being discharged due to meeting the stated rehab goals.Carey Bullocks, OTR/L 01/24/2021, 8:40 AM  [facility] [address] [place], [place], [address] Phone: [phone]   Fax:  [phone]  Name: Carless Slatten MRN: [id number] Date of Birth: Nov 21, 1970

## 2021-02-14 ENCOUNTER — Encounter: Payer: Self-pay | Admitting: Emergency Medicine

## 2021-02-21 ENCOUNTER — Other Ambulatory Visit: Payer: Self-pay

## 2021-02-21 ENCOUNTER — Encounter: Payer: Self-pay | Admitting: Emergency Medicine

## 2021-02-21 ENCOUNTER — Ambulatory Visit (INDEPENDENT_AMBULATORY_CARE_PROVIDER_SITE_OTHER): Payer: Self-pay | Admitting: Orthopedic Surgery

## 2021-02-21 DIAGNOSIS — S52572A Other intraarticular fracture of lower end of left radius, initial encounter for closed fracture: Secondary | ICD-10-CM

## 2021-02-21 NOTE — Telephone Encounter (Signed)
Congratulations!        PRE-OPERATIVE INSTRUCTIONS        Please do the body wash as directed below  Do not smoke after midnight the night before surgery - this will increase your risk of nausea and vomiting  Do not eat or drink anything for 8 hours before surgery. Failure to do this may cause cancellation of your procedure. You may drink CLEAR beverages (that you can see through) and your Ensure up to 2 hours before the procedure.   Please DO take the following medications with a sip of water on the day of your surgery: prisolec, miralax       Please go get your blood drawn on Friday, 01/13/22.   [facility] (inside [facility])  [address], [address]  [place] [address]  [phone]  Monday-Friday: 7:30 a.m. - 5 p.m.  Sunday: 7:30 - 10:30 a.m.   Closed on Saturdays   * Will open at 7 a.m. Sundays starting September 12, 2020    [facility]  [address]  [place] [address]  [phone]  Monday-Friday: 8 a.m. - 4:30 p.m.    [facility]  [address]  [place] [address]  [phone]  Monday-Friday: 7:30 a.m. - 4:30 p.m.   *Appointment required      Day of Surgery  On the day of your surgery please leave valuable items including cash and credit cards at home if you can. You can expect:  You will be asked to remove any of the following items:  Dentures and bridges  Hearing aids  Contact lenses  Nail polish from at least one finger nail  Wigs, hairpins, combs and barrettes  You will be asked to remove all your clothes and put on a special gown  Your nursing staff may start an intravenous line (IV) attached to a tube that will supply your body with fluids and medications during and after surgery.      Preparing for your surgery - Chlorhexidine Washes    Shower with Chlorhexidine (CHG) soap to prevent infection.    Instructions:  You should shower with CHG soap a minimum of three times before your surgery, or more often as directed by  your surgeon.  You can buy CHG soap over the counter at any drug store.    Showering several times before surgery blocks germ growth and provides the best protection when used at least 3 times in a row.    two nights before surgery  the night before surgery  the morning of the admission to surgery    How to shower with CHG soap:    1. Rinse your body with warm water.    5. Lather again before rinsing.   2. Wash your hair with regular shampoo.  Rinse your hair with water. If you are having  neck surgery, use CHG soap instead of your regular shampoo to wash your hair.  Rinse your hair with water.    6.      7.   Turn on the water and rinse CHG off your body.    Dry off with a clean towel.   3. Wet a clean sponge. Turn off the water. Apply CHG liberally.  8. Do not apply lotions or powders.       4. Firmly massage all areas: neck, arms, chest, back, abdomen, hips, groin, genitals  (external only) and   buttocks. Clean your legs and feet and between your fingers and  toes. Pay special attention to the site of your surgery and all surrounding skin. Ask for help to clean your back if you have a spinal surgery.  9. Use clean clothes and freshly laundered bed linens.     --- Repeat steps 1 - 9  each time you shower. ---     Caution: When using CHG soap, avoid contact with eyes, nose, ear canals and mouth.  Important reminders:    Do not use any other soaps or body wash when using CHG. Other soaps can block the CHG benefits.  After showering, do not apply lotion, cream, powder, deodorant, or hair conditioner.  Do not shave or remove body hair. Facial shaving is permitted. If you are having head surgery, ask your doctor whether you can shave.  CHG is safe to use on minor wounds, rashes, burns, and over staples and stiches.  Allergic reactions are rare but may occur. If you have an allergic reaction, stop using CHG and call your doctor if you have a skin irritation.  If you are allergic to CHG, please follow the bathing  instructions above using an over-the-counter regular soap instead of CHG.

## 2021-02-24 ENCOUNTER — Encounter: Payer: Self-pay | Admitting: Orthopedic Surgery

## 2021-02-24 NOTE — Progress Notes (Signed)
Post-Op Visit Note   Patient: Antonio Moore           Date of Birth: 1970-09-18           MRN: [id number] Visit Date: 02/21/2021 PCP: Horald Pollen, MD   Assessment & Plan:  Chief Complaint:  Chief Complaint  Patient presents with   Left Wrist - Routine Post Op     11/19/20 left wrist ORIF     Visit Diagnoses:  1. Other closed intra-articular fracture of distal end of left radius, initial encounter     Plan: Patient is a 50 year old male who presents s/p left wrist ORIF on 11/19/2020.  He is doing well overall with minimal pain.  He just notes some tenderness over the incision distally.  Nothing he has to take medication for.  Sleeping through the night without difficulty.  He has returned to doing some light weight lifting with 45 pound overhead press, 45 pound bench press, 65 pound dead lift, LAT pull down with 40 pounds.  He has no pain with any of these exercises.  On exam he has incision that is well-healed.  Minimal tenderness over the fracture site.  Excellent pronation supination that is equivalent to his contralateral wrist.  He has flexion bilaterally to 80 degrees and extension bilaterally to 70 degrees.  2+ radial pulse of the operative extremity.  Intact EPL, wrist extension, finger abduction, finger adduction, FPL.  Plan to progressively increase how much he is weight lifting over the next 4 to 6 weeks slowly.  If he feels pain he will back off to a comfortable weight.  About 6 weeks from now, should be okay to do body weight exercises and return to weightlifting as he can tolerate at that point.  Follow-up with the office as needed if he has any continued discomfort.  Patient agreed with plan.  Follow-Up Instructions: No follow-ups on file.   Orders:  No orders of the defined types were placed in this encounter.  No orders of the defined types were placed in this encounter.   Imaging: No results found.  PMFS History: Patient Active Problem List    Diagnosis Date Noted   Closed fracture of lower end of left radius with routine healing    Premature ventricular contractions (PVCs) (VPCs) 03/10/2020   Past Medical History:  Diagnosis Date   Anemia    late teens , early 35's     Family History  Problem Relation Age of Onset   Stroke Mother    Stroke Father    Stroke Sister    Cancer Brother    Pancreatic cancer Brother    Colon cancer Neg Hx    Colon polyps Neg Hx    Esophageal cancer Neg Hx    Rectal cancer Neg Hx    Stomach cancer Neg Hx     Past Surgical History:  Procedure Laterality Date   NO PAST SURGERIES     OPEN REDUCTION INTERNAL FIXATION (ORIF) DISTAL RADIAL FRACTURE Left 11/19/2020   Procedure: OPEN REDUCTION INTERNAL FIXATION (ORIF) DISTAL RADIUS FRACTURE;  Surgeon: Meredith Pel, MD;  Location: [facility];  Service: Orthopedics;  Laterality: Left;   WISDOM TOOTH EXTRACTION     Social History   Occupational History   Not on file  Tobacco Use   Smoking status: Never   Smokeless tobacco: Never  Vaping Use   Vaping Use: Never used  Substance and Sexual Activity   Alcohol use: No   Drug use: No  Sexual activity: Not on file

## 2021-03-15 ENCOUNTER — Encounter: Payer: Self-pay | Admitting: Emergency Medicine

## 2021-03-20 ENCOUNTER — Encounter: Payer: Self-pay | Admitting: Emergency Medicine

## 2021-03-23 ENCOUNTER — Encounter: Payer: Self-pay | Admitting: Emergency Medicine

## 2021-03-27 ENCOUNTER — Encounter: Payer: Self-pay | Admitting: Emergency Medicine

## 2021-04-04 ENCOUNTER — Encounter: Payer: Self-pay | Admitting: Emergency Medicine

## 2021-04-16 ENCOUNTER — Encounter: Payer: Self-pay | Admitting: Emergency Medicine

## 2021-06-01 ENCOUNTER — Encounter: Payer: Self-pay | Admitting: Emergency Medicine

## 2021-06-06 ENCOUNTER — Ambulatory Visit (INDEPENDENT_AMBULATORY_CARE_PROVIDER_SITE_OTHER): Payer: BC Managed Care – PPO | Admitting: Emergency Medicine

## 2021-06-06 ENCOUNTER — Encounter: Payer: Self-pay | Admitting: Emergency Medicine

## 2021-06-06 ENCOUNTER — Other Ambulatory Visit: Payer: Self-pay

## 2021-06-06 ENCOUNTER — Encounter: Payer: Self-pay | Admitting: Orthopedic Surgery

## 2021-06-06 VITALS — BP 118/70 | HR 56 | Ht 71.0 in | Wt 181.0 lb

## 2021-06-06 DIAGNOSIS — Z13228 Encounter for screening for other metabolic disorders: Secondary | ICD-10-CM | POA: Diagnosis not present

## 2021-06-06 DIAGNOSIS — Z13220 Encounter for screening for lipoid disorders: Secondary | ICD-10-CM

## 2021-06-06 DIAGNOSIS — Z1329 Encounter for screening for other suspected endocrine disorder: Secondary | ICD-10-CM | POA: Diagnosis not present

## 2021-06-06 DIAGNOSIS — Z0000 Encounter for general adult medical examination without abnormal findings: Secondary | ICD-10-CM | POA: Diagnosis not present

## 2021-06-06 DIAGNOSIS — Z130 Encounter for screening for diseases of the blood and blood-forming organs and certain disorders involving the immune mechanism: Secondary | ICD-10-CM

## 2021-06-06 LAB — CBC WITH DIFFERENTIAL/PLATELET
Basophils Absolute: 0 10*3/uL (ref 0.0–0.1)
Basophils Relative: 0.8 % (ref 0.0–3.0)
Eosinophils Absolute: 0.1 10*3/uL (ref 0.0–0.7)
Eosinophils Relative: 3.6 % (ref 0.0–5.0)
HCT: 40.1 % (ref 39.0–52.0)
Hemoglobin: 13.2 g/dL (ref 13.0–17.0)
Lymphocytes Relative: 25.2 % (ref 12.0–46.0)
Lymphs Abs: 1 10*3/uL (ref 0.7–4.0)
MCHC: 33 g/dL (ref 30.0–36.0)
MCV: 90.7 fl (ref 78.0–100.0)
Monocytes Absolute: 0.2 10*3/uL (ref 0.1–1.0)
Monocytes Relative: 6.4 % (ref 3.0–12.0)
Neutro Abs: 2.5 10*3/uL (ref 1.4–7.7)
Neutrophils Relative %: 64 % (ref 43.0–77.0)
Platelets: 197 10*3/uL (ref 150.0–400.0)
RBC: 4.42 Mil/uL (ref 4.22–5.81)
RDW: 14.5 % (ref 11.5–15.5)
WBC: 3.8 10*3/uL — ABNORMAL LOW (ref 4.0–10.5)

## 2021-06-06 LAB — LIPID PANEL
Cholesterol: 140 mg/dL (ref 0–200)
HDL: 63.4 mg/dL (ref >39.00)
LDL Cholesterol: 67 mg/dL (ref 0–99)
NonHDL: 76.59
Total CHOL/HDL Ratio: 2
Triglycerides: 46 mg/dL (ref 0.0–149.0)
VLDL: 9.2 mg/dL (ref 0.0–40.0)

## 2021-06-06 LAB — COMPREHENSIVE METABOLIC PANEL
ALT: 15 U/L (ref 0–53)
AST: 20 U/L (ref 0–37)
Albumin: 4.2 g/dL (ref 3.5–5.2)
Alkaline Phosphatase: 79 U/L (ref 39–117)
BUN: 9 mg/dL (ref 6–23)
CO2: 31 mEq/L (ref 19–32)
Calcium: 9.2 mg/dL (ref 8.4–10.5)
Chloride: 104 mEq/L (ref 96–112)
Creatinine, Ser: 0.89 mg/dL (ref 0.40–1.50)
GFR: 99.16 mL/min (ref >60.00)
Glucose, Bld: 84 mg/dL (ref 70–99)
Potassium: 4.2 mEq/L (ref 3.5–5.1)
Sodium: 140 mEq/L (ref 135–145)
Total Bilirubin: 0.6 mg/dL (ref 0.2–1.2)
Total Protein: 6.6 g/dL (ref 6.0–8.3)

## 2021-06-06 LAB — HEMOGLOBIN A1C: Hgb A1c MFr Bld: 5.4 % (ref 4.6–6.5)

## 2021-06-06 NOTE — Patient Instructions (Signed)

## 2021-06-06 NOTE — Progress Notes (Signed)
Antonio Moore 51 y.o.   Chief Complaint  Patient presents with   Annual Exam    HISTORY OF PRESENT ILLNESS: This is a 51 y.o. male here for annual exam. Healthy male with a healthy lifestyle. Only complaint today is left ear fullness. No other complaints or medical concerns today. Recent left wrist fracture requiring surgery.  HPI   Prior to Admission medications   Medication Sig Start Date End Date Taking? Authorizing Provider  Multiple Vitamin (MULTI-VITAMIN PO) Take 1 tablet by mouth daily.   Yes [provider]  OMEGA-3 FATTY ACIDS PO Take 2 capsules by mouth daily.   Yes [provider]    Allergies  Allergen Reactions   Wasp Venom Swelling    Patient Active Problem List   Diagnosis Date Noted   Closed fracture of lower end of left radius with routine healing    Premature ventricular contractions (PVCs) (VPCs) 03/10/2020    Past Medical History:  Diagnosis Date   Anemia    late teens , early 20's     Past Surgical History:  Procedure Laterality Date   NO PAST SURGERIES     OPEN REDUCTION INTERNAL FIXATION (ORIF) DISTAL RADIAL FRACTURE Left 11/19/2020   Procedure: OPEN REDUCTION INTERNAL FIXATION (ORIF) DISTAL RADIUS FRACTURE;  Surgeon: Meredith Pel, MD;  Location: [facility];  Service: Orthopedics;  Laterality: Left;   WISDOM TOOTH EXTRACTION      Social History   Socioeconomic History   Marital status: Unknown    Spouse name: Not on file   Number of children: Not on file   Years of education: Not on file   Highest education level: Not on file  Occupational History   Not on file  Tobacco Use   Smoking status: Never   Smokeless tobacco: Never  Vaping Use   Vaping Use: Never used  Substance and Sexual Activity   Alcohol use: No   Drug use: No   Sexual activity: Not on file  Other Topics Concern   Not on file  Social History Narrative   ** Merged History Encounter **       Social Determinants of Health    Financial Resource Strain: Not on file  Food Insecurity: Not on file  Transportation Needs: Not on file  Physical Activity: Not on file  Stress: Not on file  Social Connections: Not on file  Intimate Partner Violence: Not on file    Family History  Problem Relation Age of Onset   Stroke Mother    Stroke Father    Stroke Sister    Cancer Brother    Pancreatic cancer Brother    Colon cancer Neg Hx    Colon polyps Neg Hx    Esophageal cancer Neg Hx    Rectal cancer Neg Hx    Stomach cancer Neg Hx      Review of Systems  Constitutional: Negative.  Negative for chills and fever.  HENT: Negative.  Negative for congestion and sore throat.   Eyes: Negative.   Respiratory: Negative.  Negative for cough and shortness of breath.   Cardiovascular: Negative.  Negative for chest pain and palpitations.  Gastrointestinal: Negative.  Negative for abdominal pain, diarrhea, nausea and vomiting.  Genitourinary: Negative.  Negative for dysuria and hematuria.  Skin: Negative.  Negative for rash.  Neurological: Negative.  Negative for dizziness and headaches.  All other systems reviewed and are negative.   Physical Exam Vitals reviewed.  Constitutional:      Appearance:  Normal appearance.  HENT:     Head: Normocephalic.     Right Ear: Tympanic membrane, ear canal and external ear normal.     Left Ear: There is impacted cerumen.     Mouth/Throat:     Mouth: Mucous membranes are moist.     Pharynx: Oropharynx is clear.  Eyes:     Extraocular Movements: Extraocular movements intact.     Conjunctiva/sclera: Conjunctivae normal.     Pupils: Pupils are equal, round, and reactive to light.  Cardiovascular:     Rate and Rhythm: Normal rate and regular rhythm.     Pulses: Normal pulses.     Heart sounds: Normal heart sounds.  Pulmonary:     Effort: Pulmonary effort is normal.     Breath sounds: Normal breath sounds.  Abdominal:     General: Bowel sounds are normal. There is no  distension.     Palpations: Abdomen is soft. There is no mass.     Tenderness: There is no abdominal tenderness.  Musculoskeletal:        General: Normal range of motion.     Cervical back: Normal range of motion and neck supple. No tenderness.     Comments: Left wrist: Keloid surgical scar.  Healing well.  No infection.  Full range of motion  Lymphadenopathy:     Cervical: No cervical adenopathy.  Skin:    General: Skin is warm and dry.     Capillary Refill: Capillary refill takes less than 2 seconds.  Neurological:     General: No focal deficit present.     Mental Status: He is alert and oriented to person, place, and time.  Psychiatric:        Mood and Affect: Mood normal.        Behavior: Behavior normal.     ASSESSMENT & PLAN: Problem List Items Addressed This Visit   None Visit Diagnoses     Routine general medical examination at a health care facility    -  Primary   Screening for deficiency anemia       Relevant Orders   CBC with Differential   Screening for lipoid disorders       Relevant Orders   Lipid panel   Screening for endocrine, metabolic and immunity disorder       Relevant Orders   Comprehensive metabolic panel   Hemoglobin A1c      Modifiable risk factors discussed with patient. Anticipatory guidance according to age provided. The following topics were also discussed: Social Determinants of Health Smoking.  Non-smoker Diet and nutrition Benefits of exercise Cancer screening and review of most recent colonoscopy report Vaccinations recommendations. Cardiovascular risk assessment The 10-year ASCVD risk score (Arnett DK, et al., 2019) is: 4.2%   Values used to calculate the score:     Age: 20 years     Sex: Male     Is Non-Hispanic African American: Yes     Diabetic: No     Tobacco smoker: No     Systolic Blood Pressure: 993 mmHg     Is BP treated: No     HDL Cholesterol: 61 mg/dL     Total Cholesterol: 150 mg/dL  Mental health including  depression and anxiety Fall and accident prevention  Patient Instructions  Health Maintenance, Male Adopting a healthy lifestyle and getting preventive care are important in promoting health and wellness. Ask your health care provider about: The right schedule for you to have regular tests and exams. Things  you can do on your own to prevent diseases and keep yourself healthy. What should I know about diet, weight, and exercise? Eat a healthy diet  Eat a diet that includes plenty of vegetables, fruits, low-fat dairy products, and lean protein. Do not eat a lot of foods that are high in solid fats, added sugars, or sodium. Maintain a healthy weight Body mass index (BMI) is a measurement that can be used to identify possible weight problems. It estimates body fat based on height and weight. Your health care provider can help determine your BMI and help you achieve or maintain a healthy weight. Get regular exercise Get regular exercise. This is one of the most important things you can do for your health. Most adults should: Exercise for at least 150 minutes each week. The exercise should increase your heart rate and make you sweat (moderate-intensity exercise). Do strengthening exercises at least twice a week. This is in addition to the moderate-intensity exercise. Spend less time sitting. Even light physical activity can be beneficial. Watch cholesterol and blood lipids Have your blood tested for lipids and cholesterol at 52 years of age, then have this test every 5 years. You may need to have your cholesterol levels checked more often if: Your lipid or cholesterol levels are high. You are older than 52 years of age. You are at high risk for heart disease. What should I know about cancer screening? Many types of cancers can be detected early and may often be prevented. Depending on your health history and family history, you may need to have cancer screening at various ages. This may include  screening for: Colorectal cancer. Prostate cancer. Skin cancer. Lung cancer. What should I know about heart disease, diabetes, and high blood pressure? Blood pressure and heart disease High blood pressure causes heart disease and increases the risk of stroke. This is more likely to develop in people who have high blood pressure readings or are overweight. Talk with your health care provider about your target blood pressure readings. Have your blood pressure checked: Every 3-5 years if you are 20-21 years of age. Every year if you are 47 years old or older. If you are between the ages of 70 and 68 and are a current or former smoker, ask your health care provider if you should have a one-time screening for abdominal aortic aneurysm (AAA). Diabetes Have regular diabetes screenings. This checks your fasting blood sugar level. Have the screening done: Once every three years after age 41 if you are at a normal weight and have a low risk for diabetes. More often and at a younger age if you are overweight or have a high risk for diabetes. What should I know about preventing infection? Hepatitis B If you have a higher risk for hepatitis B, you should be screened for this virus. Talk with your health care provider to find out if you are at risk for hepatitis B infection. Hepatitis C Blood testing is recommended for: Everyone born from [birth year] through 1965. Anyone with known risk factors for hepatitis C. Sexually transmitted infections (STIs) You should be screened each year for STIs, including gonorrhea and chlamydia, if: You are sexually active and are younger than 52 years of age. You are older than 51 years of age and your health care provider tells you that you are at risk for this type of infection. Your sexual activity has changed since you were last screened, and you are at increased risk for chlamydia or gonorrhea. Ask  your health care provider if you are at risk. Ask your health care  provider about whether you are at high risk for HIV. Your health care provider may recommend a prescription medicine to help prevent HIV infection. If you choose to take medicine to prevent HIV, you should first get tested for HIV. You should then be tested every 3 months for as long as you are taking the medicine. Follow these instructions at home: Alcohol use Do not drink alcohol if your health care provider tells you not to drink. If you drink alcohol: Limit how much you have to 0-2 drinks a day. Know how much alcohol is in your drink. In the [place] one drink equals one 12 oz bottle of beer (355 mL), one 5 oz glass of wine (148 mL), or one 1 oz glass of hard liquor (44 mL). Lifestyle Do not use any products that contain nicotine or tobacco. These products include cigarettes, chewing tobacco, and vaping devices, such as e-cigarettes. If you need help quitting, ask your health care provider. Do not use street drugs. Do not share needles. Ask your health care provider for help if you need support or information about quitting drugs. General instructions Schedule regular health, dental, and eye exams. Stay current with your vaccines. Tell your health care provider if: You often feel depressed. You have ever been abused or do not feel safe at home. Summary Adopting a healthy lifestyle and getting preventive care are important in promoting health and wellness. Follow your health care provider's instructions about healthy diet, exercising, and getting tested or screened for diseases. Follow your health care provider's instructions on monitoring your cholesterol and blood pressure. This information is not intended to replace advice given to you by your health care provider. Make sure you discuss any questions you have with your health care provider. Document Revised: 10/04/2020 Document Reviewed: 10/04/2020 Elsevier Patient Education  2022 [place], MD [place]  [facility] at [facility]

## 2021-11-03 IMAGING — CR DG KNEE COMPLETE 4+V*L*
4 series · 4 of 4 positions shown · non-contrast
Comparison: None.

CLINICAL DATA: Acute LEFT knee pain following fall today. Initial
encounter.

EXAM:
LEFT KNEE - COMPLETE 4+ VIEW

[knee ap (1 of 2)]
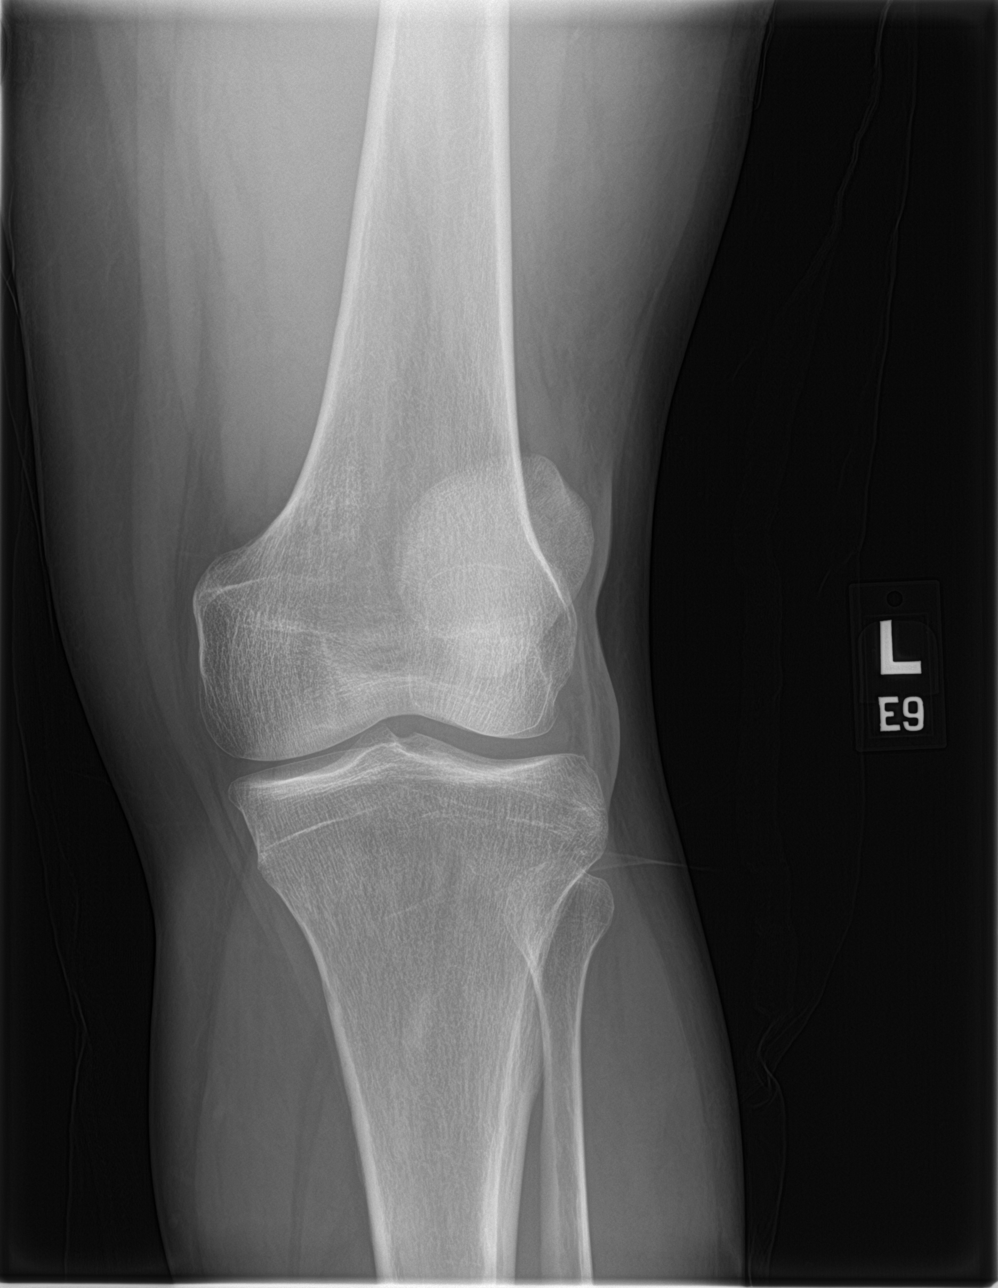

[knee obl]
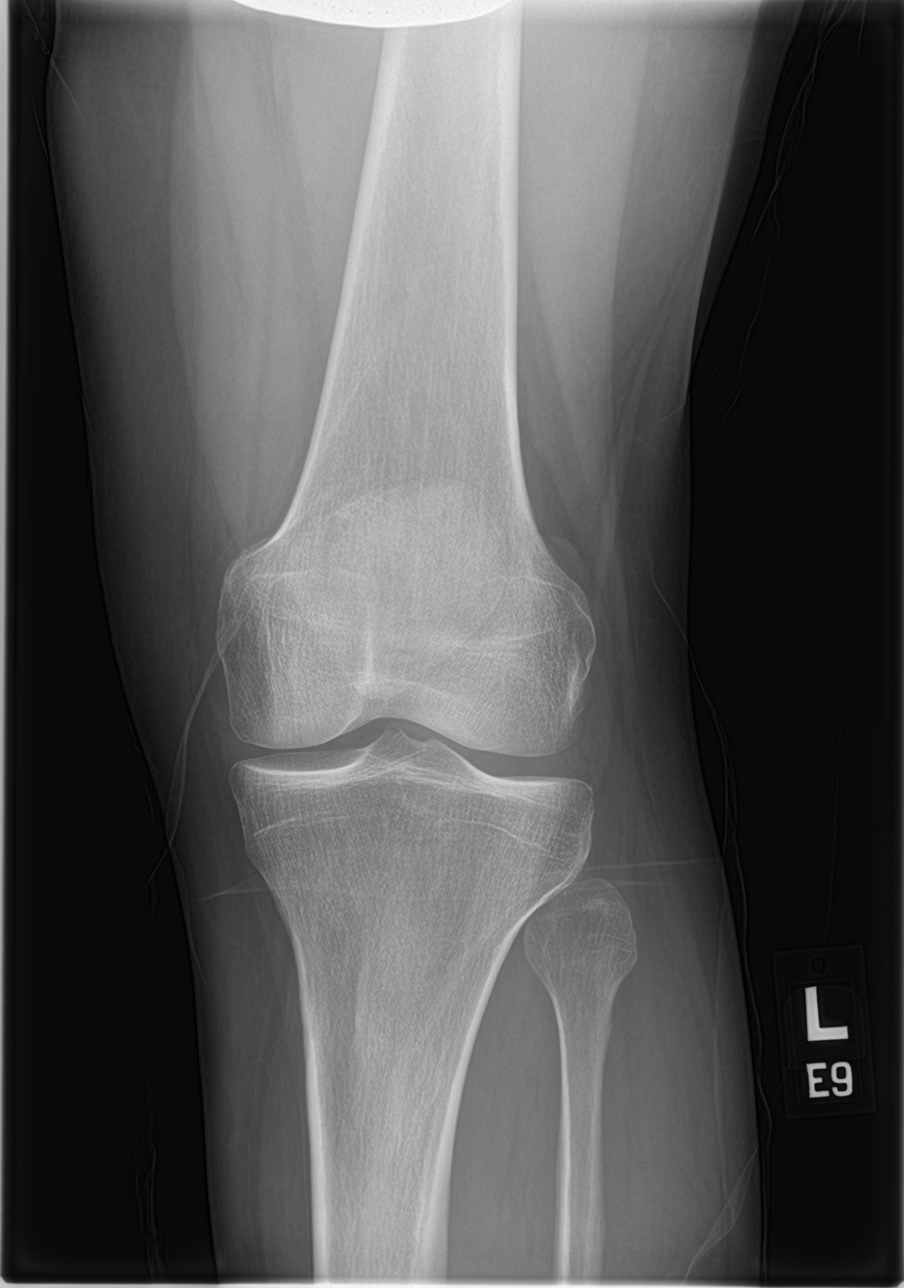

[knee ap (2 of 2)]
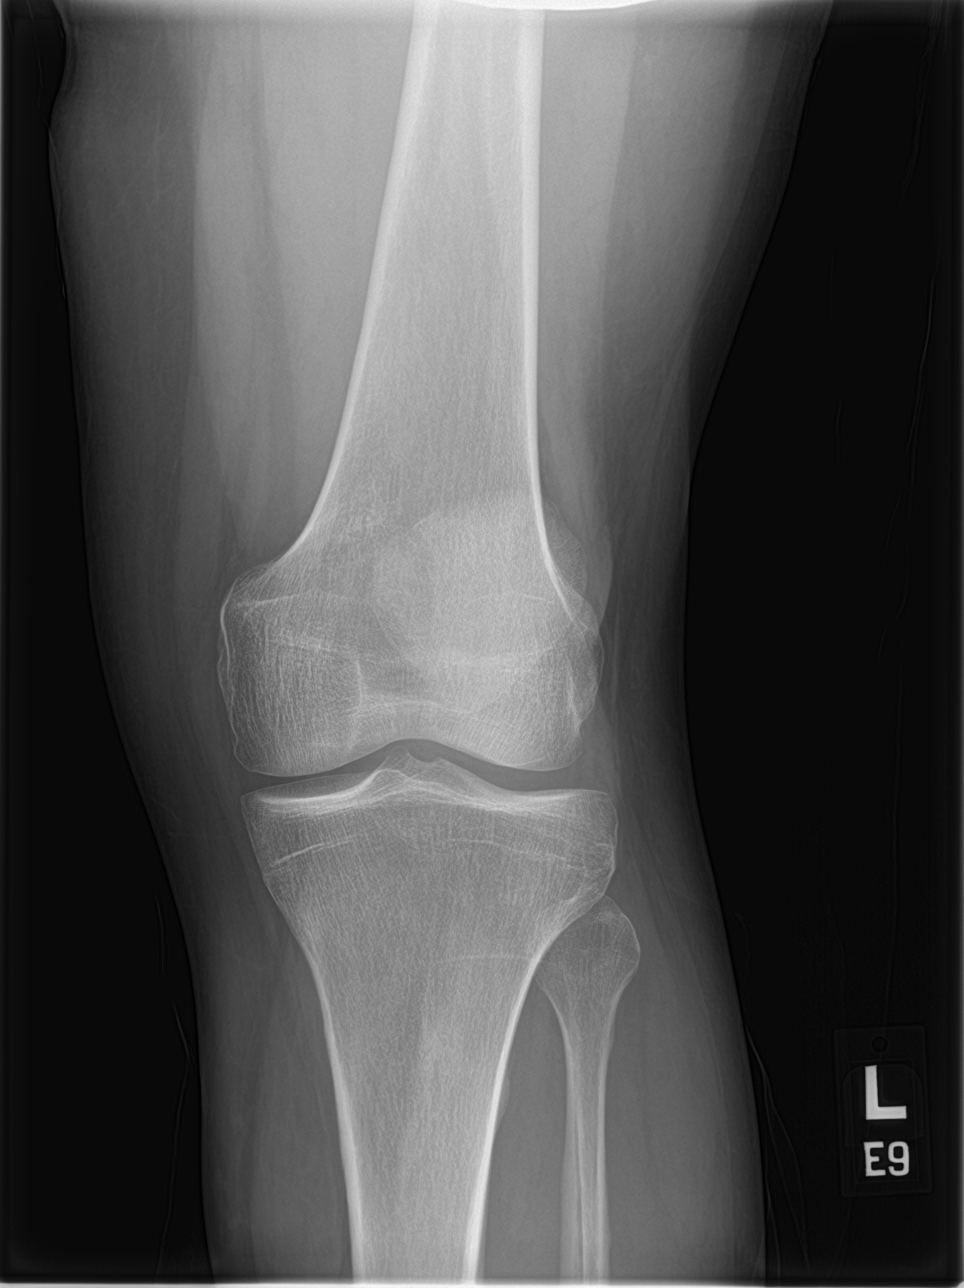

[knee lat]
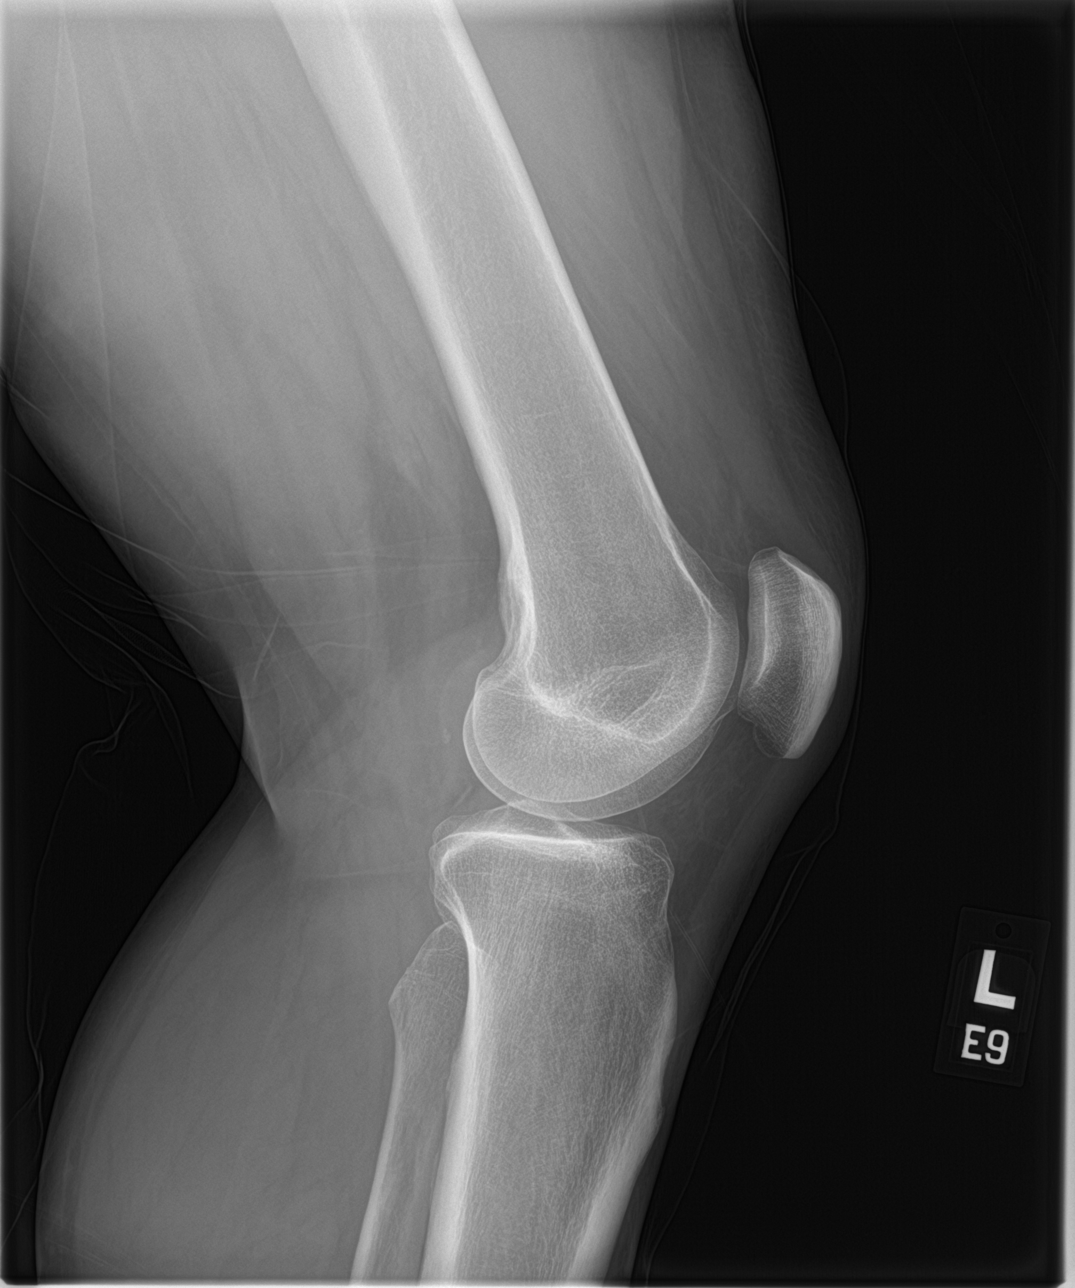

[4 of 4 positions shown; findings below may reference images not displayed]

FINDINGS: No evidence of fracture, dislocation, or joint effusion. No evidence
of arthropathy or other focal bone abnormality. Soft tissues are
unremarkable.
IMPRESSION: Negative.

## 2021-11-03 IMAGING — DX DG WRIST COMPLETE 3+V*L*
4 series · 4 of 4 positions shown · non-contrast
Comparison: None.

CLINICAL DATA: 50-year-old male status post fall when missed step.
Pain.

EXAM:
LEFT WRIST - COMPLETE 3+ VIEW

[wrist pa]
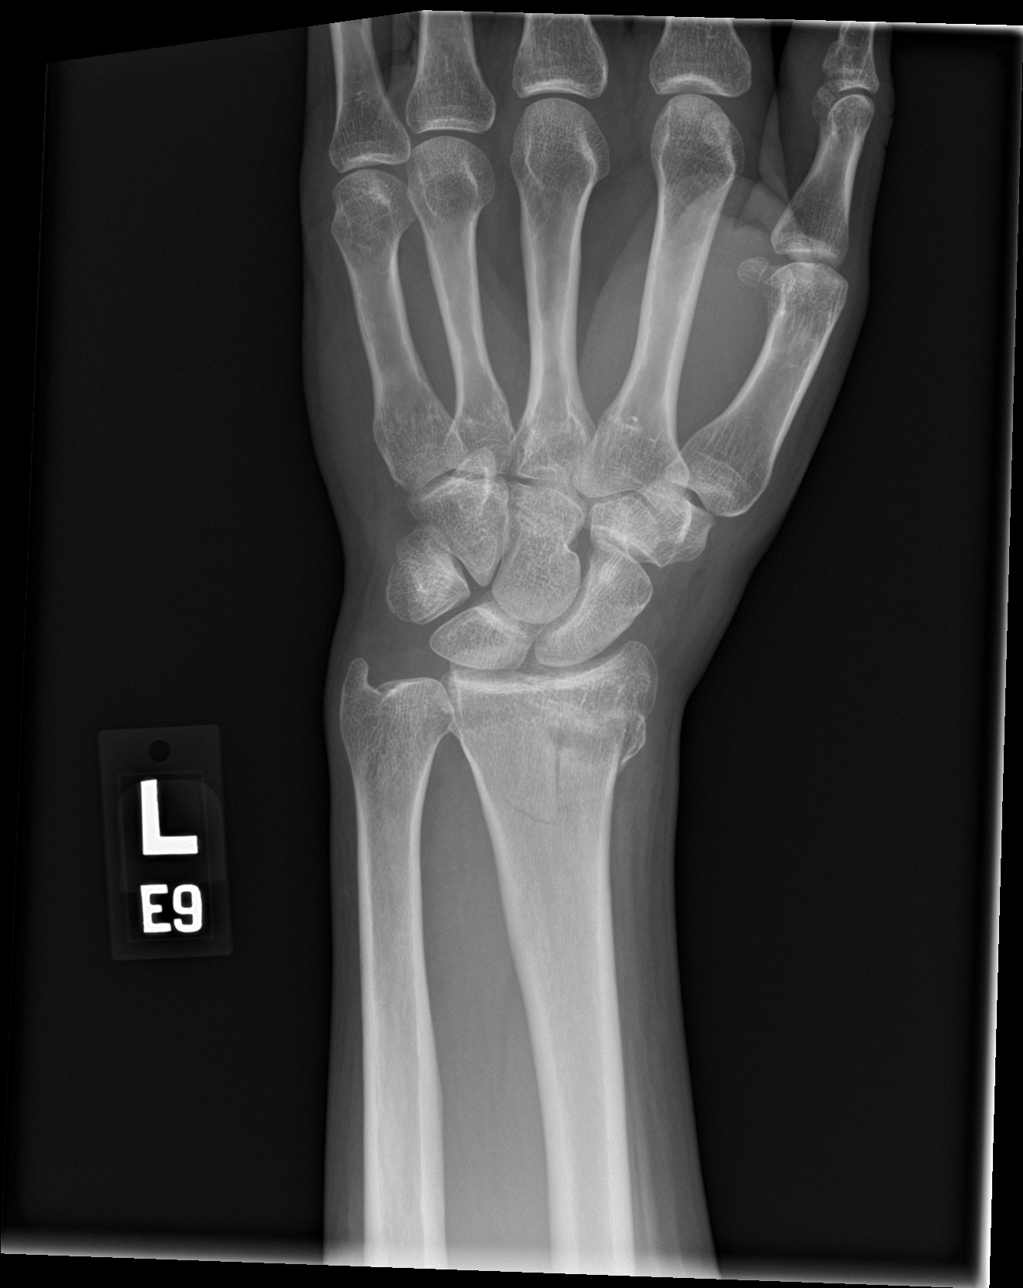

[wrist obl]
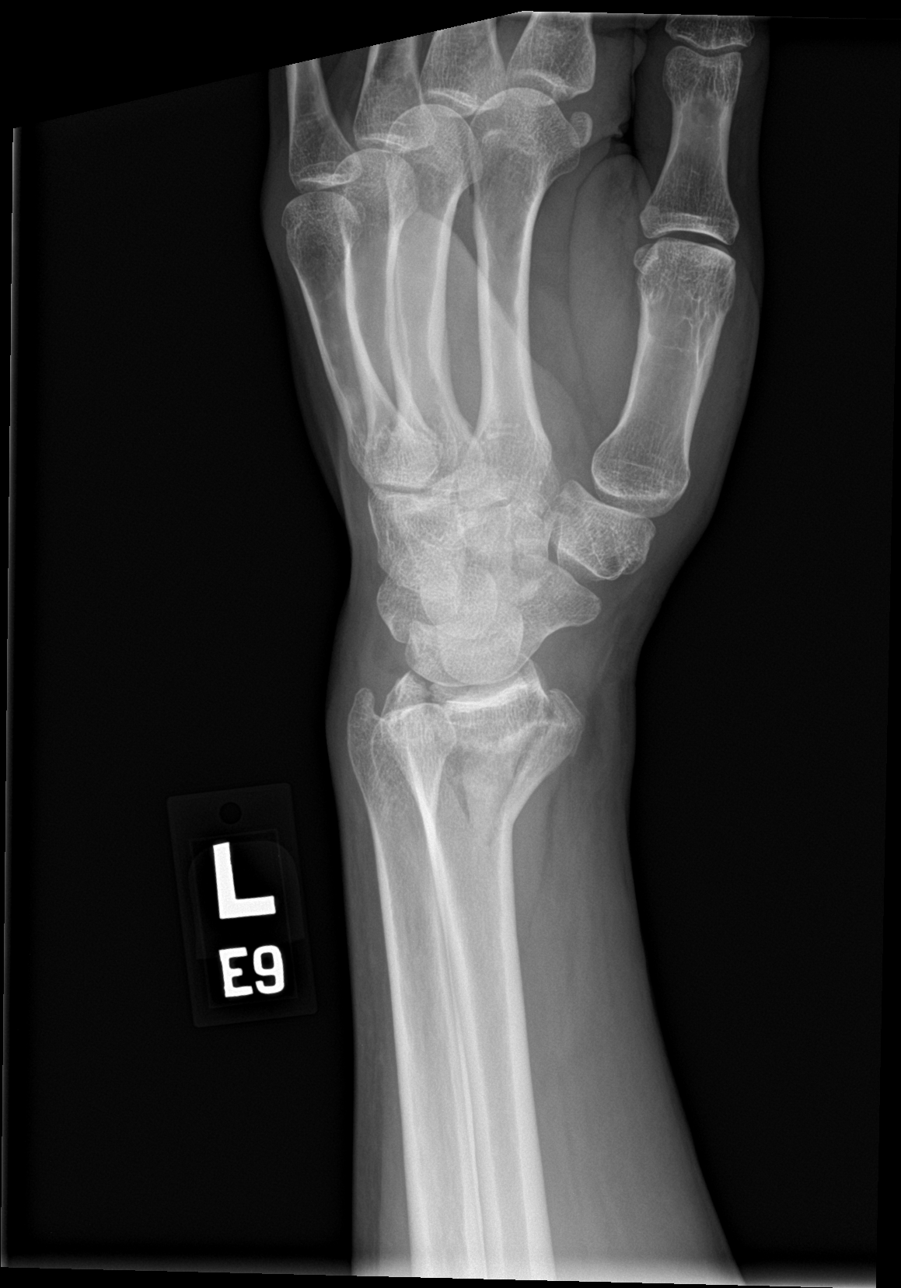

[wrist lat]
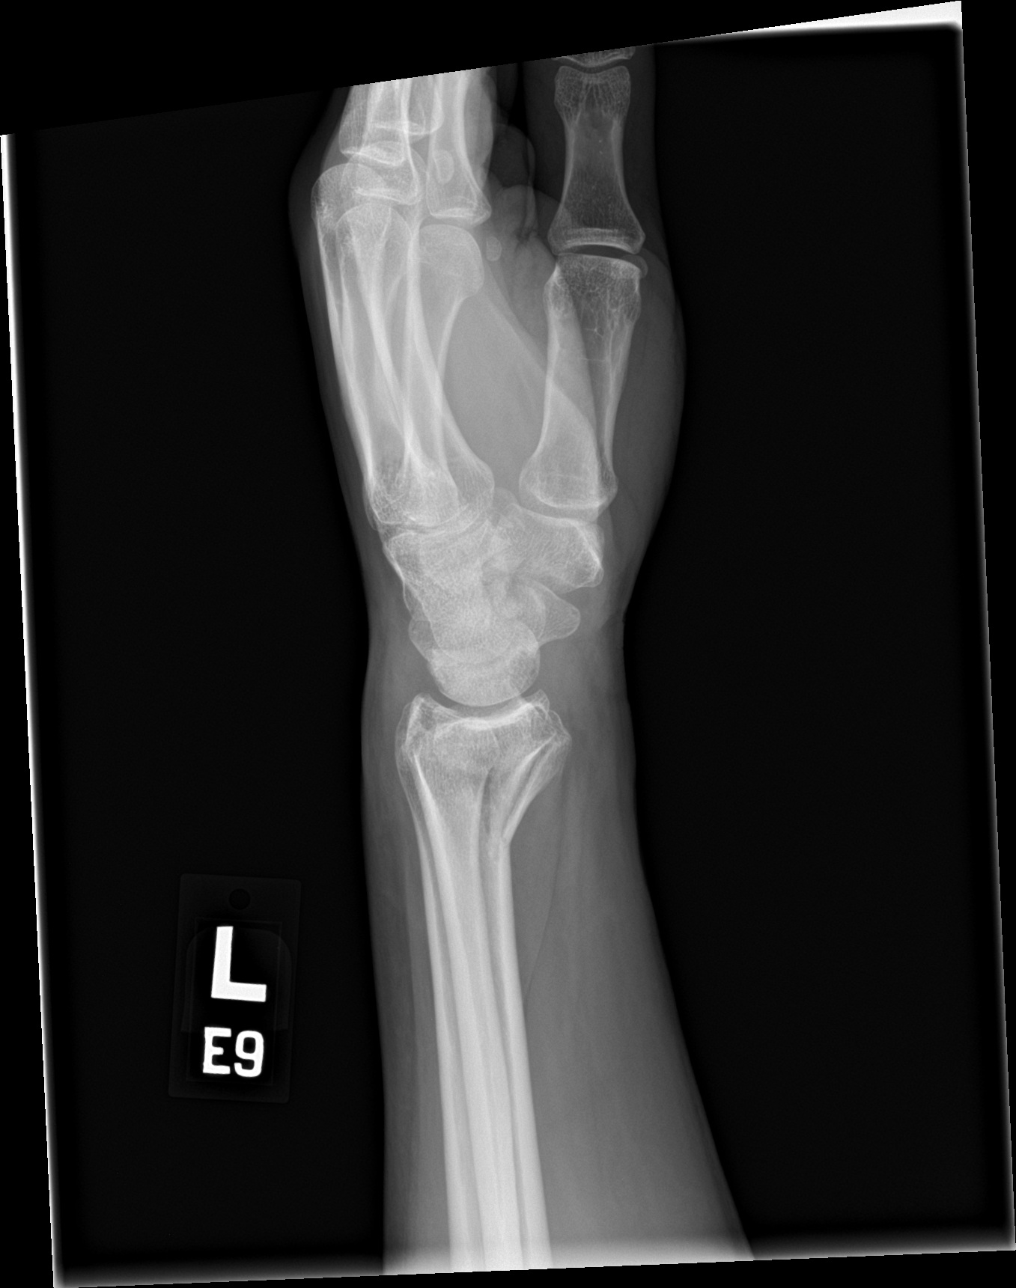

[wrist navicular]
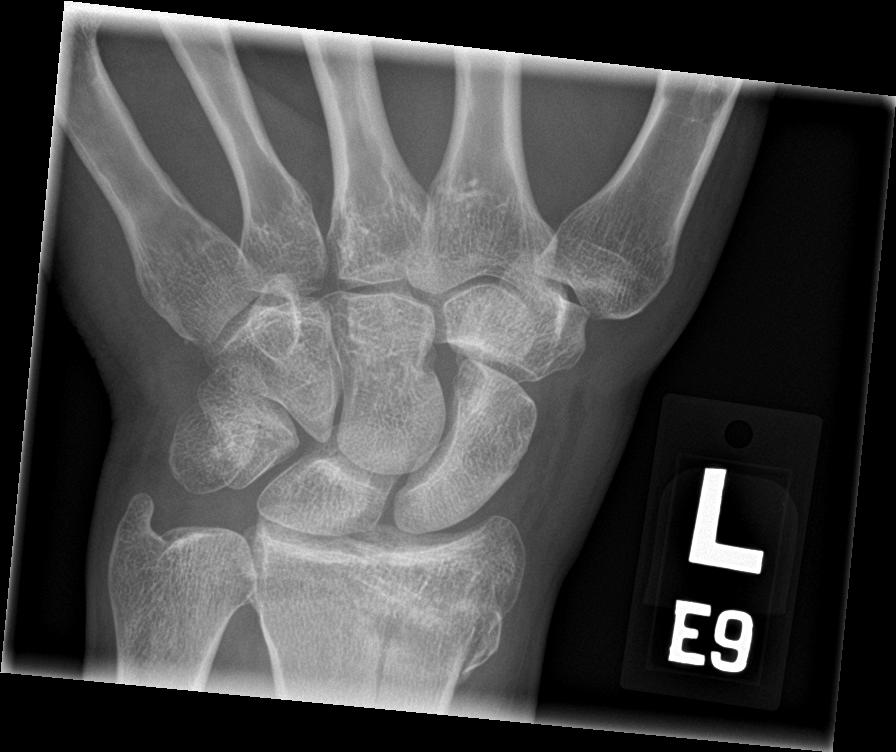

[4 of 4 positions shown; findings below may reference images not displayed]

FINDINGS: Distal left ulna intact. Comminuted distal left radius fracture with
radiocarpal joint involvement. Mild impaction, minimal fracture
fragment displacement. Carpal bone alignment and joint spaces
preserved. Metacarpals intact. Soft tissue swelling at the wrist.
IMPRESSION: Comminuted but only minimally to mildly impacted distal left radius
fracture with radiocarpal joint involvement.

## 2021-11-03 IMAGING — DX DG ANKLE COMPLETE 3+V*L*
3 series · 3 of 3 positions shown · non-contrast
Comparison: None.

CLINICAL DATA: 50-year-old male status post fall when missed step.
Pain.

EXAM:
LEFT ANKLE COMPLETE - 3+ VIEW

[ankle ap]
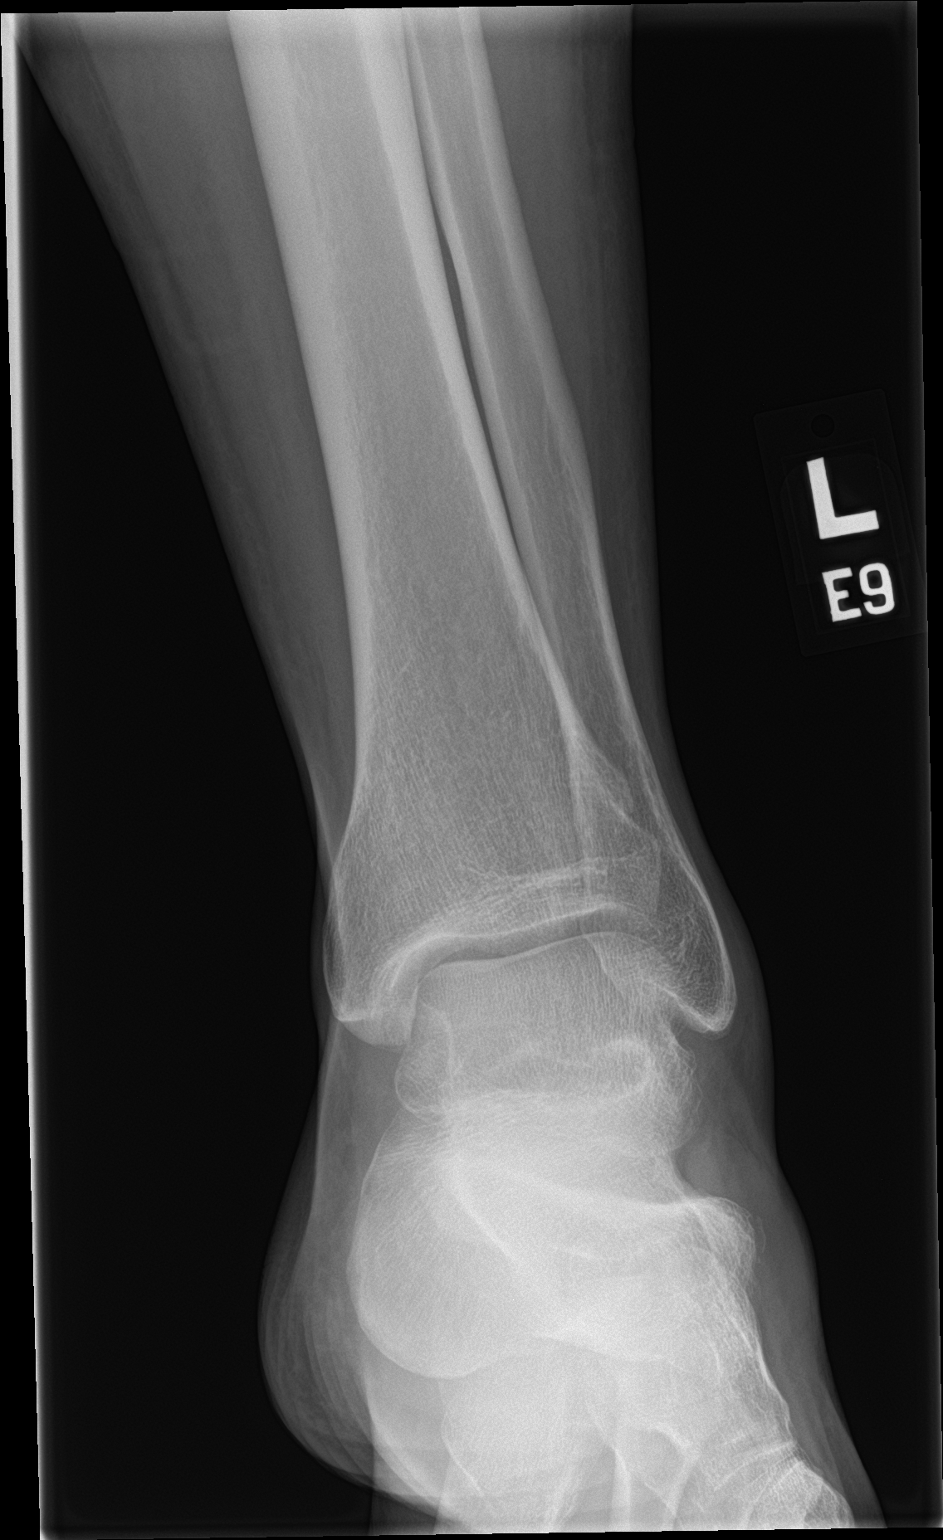

[ankle obl]
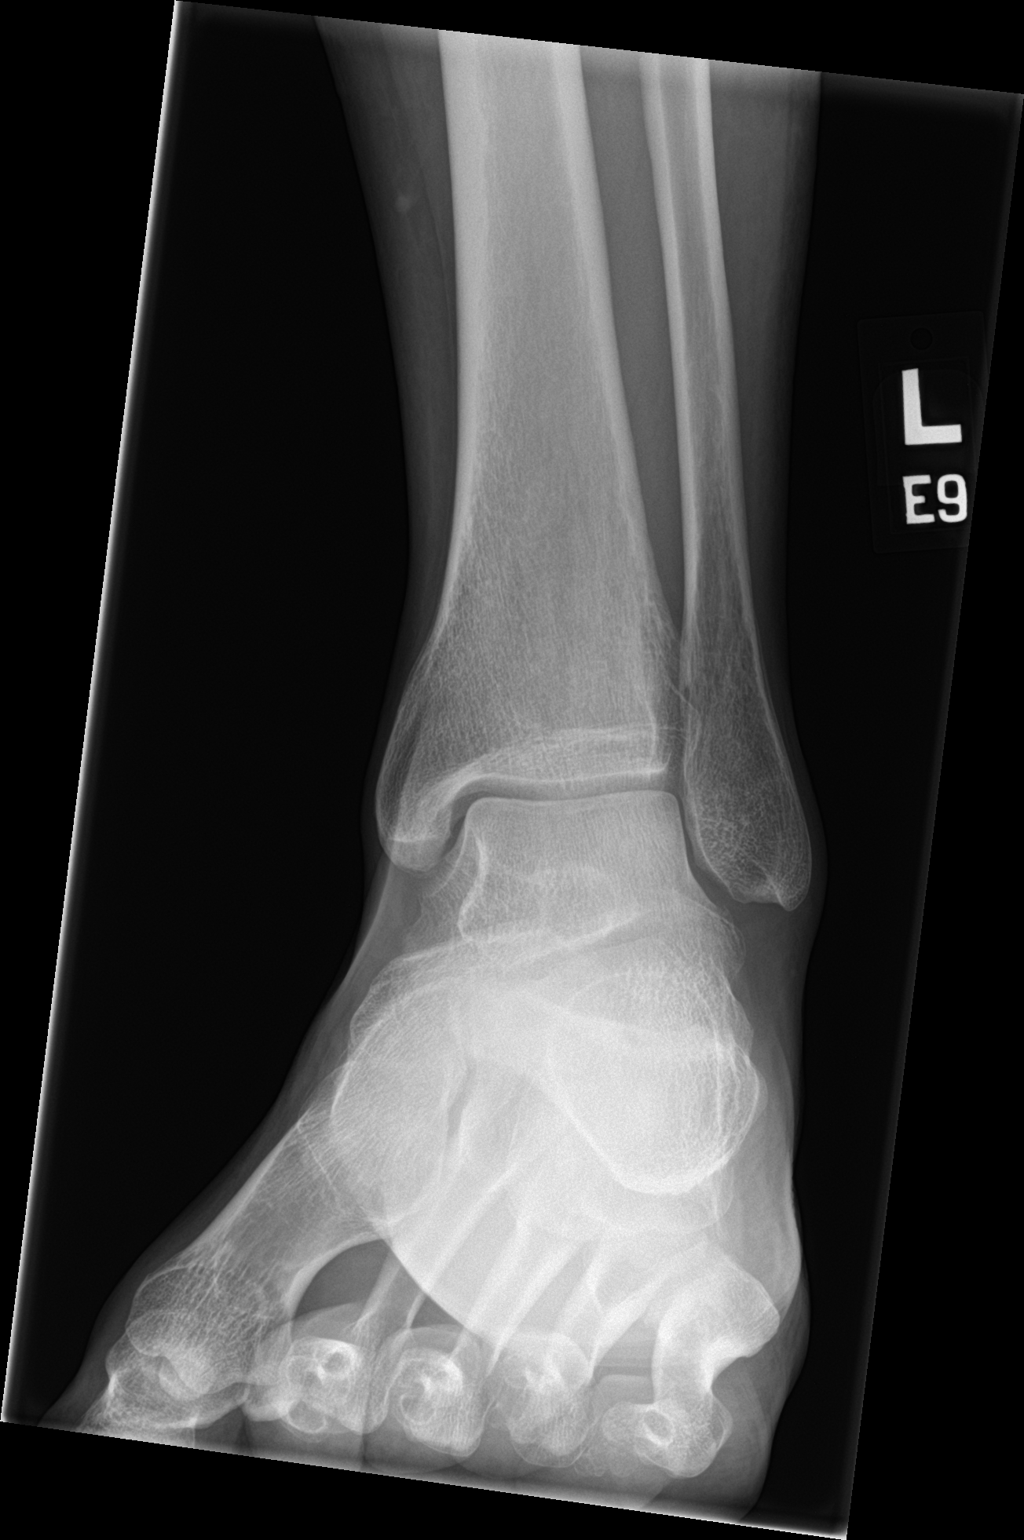

[ankle lat]
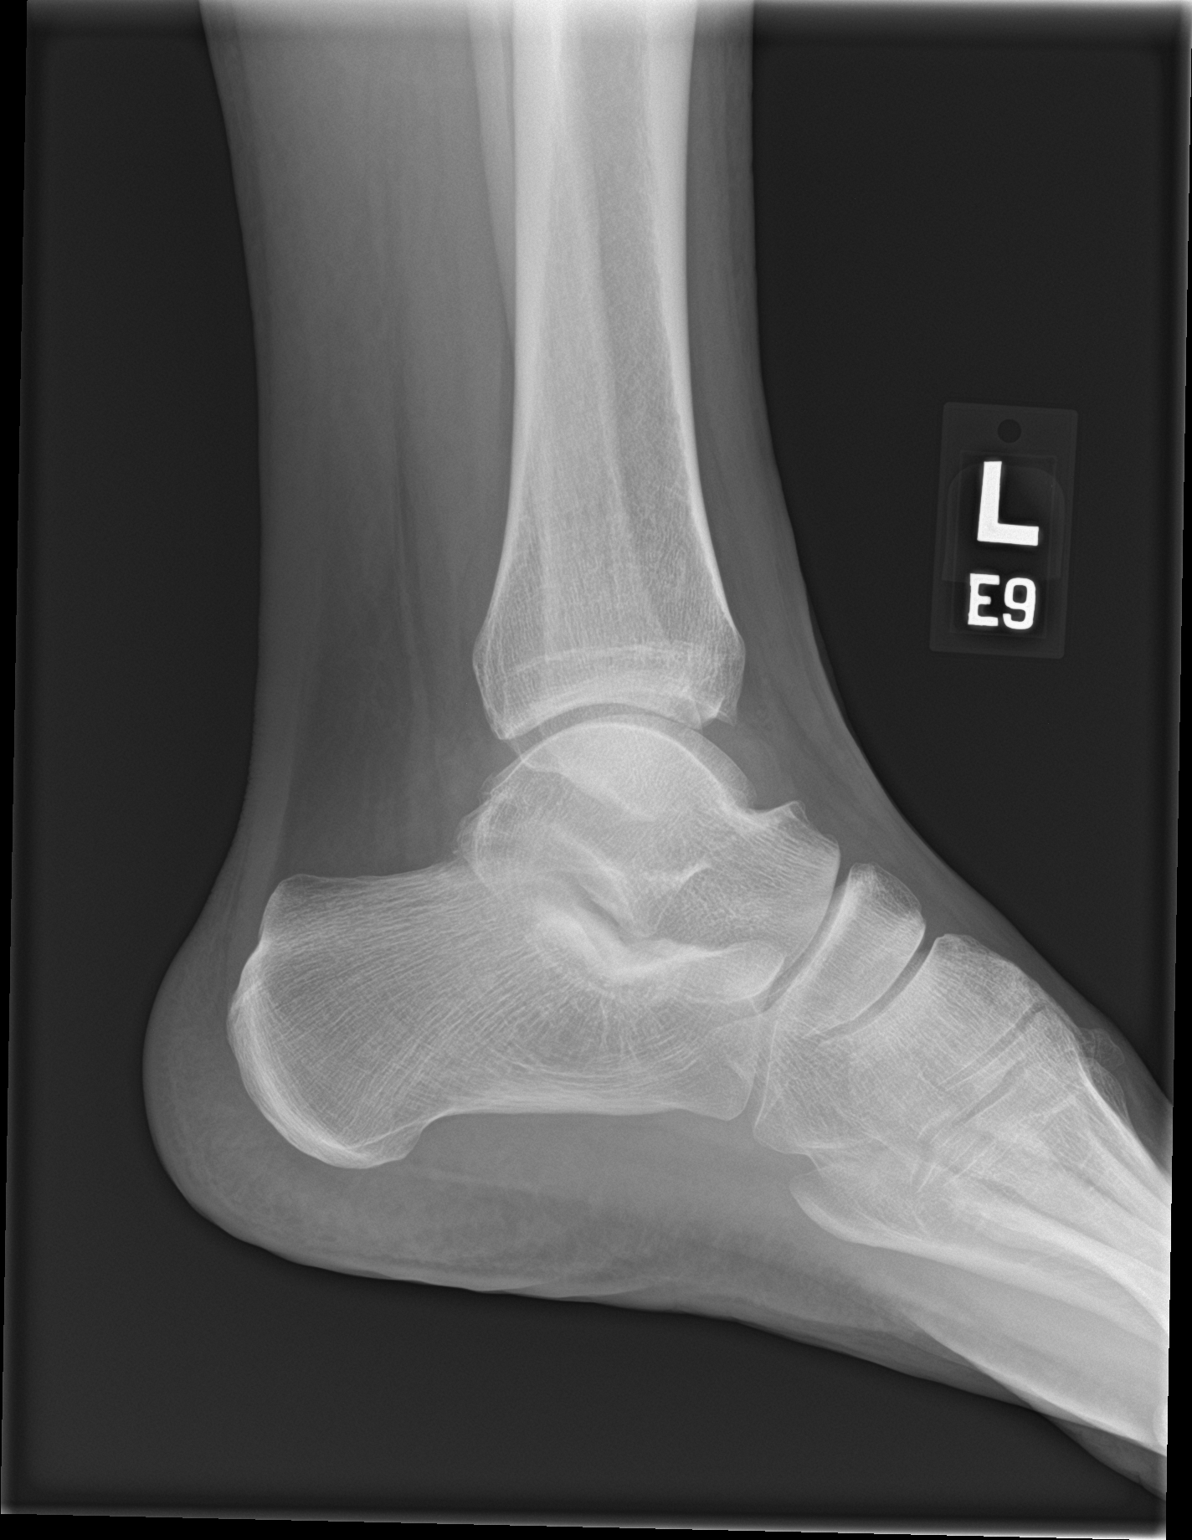

[3 of 3 positions shown; findings below may reference images not displayed]

FINDINGS: Evidence of ankle joint effusion on the lateral. Intact calcaneus.
Preserved mortise joint alignment. Talar dome intact. No distal
tibia or fibula fracture. Other visible bones of the left foot
appear intact.
IMPRESSION: Evidence of joint effusion. No acute fracture or dislocation
identified about the left ankle.

## 2022-05-01 ENCOUNTER — Ambulatory Visit
Admission: EM | Admit: 2022-05-01 | Discharge: 2022-05-01 | Disposition: A | Discharge status: Home / Self Care | Payer: BC Managed Care – PPO

## 2022-05-01 DIAGNOSIS — J069 Acute upper respiratory infection, unspecified: Secondary | ICD-10-CM

## 2022-05-01 NOTE — Discharge Instructions (Signed)
Return if any problems.

## 2022-05-01 NOTE — ED Triage Notes (Signed)
Pt presents with sore throat and cough X 3 days.

## 2022-05-05 NOTE — ED Provider Notes (Signed)
[facility]    CSN: [id number] Arrival date & time: 05/01/22  1209      History   Chief Complaint Chief Complaint  Patient presents with   Sore Throat    HPI Antonio Moore is a 51 y.o. male.   Pt reports he has had a sore throat and a cough for the past 3 days.  Pt denies fever or chills    The history is provided by the patient. No language interpreter was used.  Sore Throat This is a new problem. The current episode started more than 2 days ago. The problem occurs constantly. The problem has been gradually worsening.    Past Medical History:  Diagnosis Date   Anemia    late teens , early 20's     Patient Active Problem List   Diagnosis Date Noted   Closed fracture of lower end of left radius with routine healing    Premature ventricular contractions (PVCs) (VPCs) 03/10/2020    Past Surgical History:  Procedure Laterality Date   NO PAST SURGERIES     OPEN REDUCTION INTERNAL FIXATION (ORIF) DISTAL RADIAL FRACTURE Left 11/19/2020   Procedure: OPEN REDUCTION INTERNAL FIXATION (ORIF) DISTAL RADIUS FRACTURE;  Surgeon: Meredith Pel, MD;  Location: [facility];  Service: Orthopedics;  Laterality: Left;   WISDOM TOOTH EXTRACTION         Home Medications    Prior to Admission medications   Medication Sig Start Date End Date Taking? Authorizing Provider  Multiple Vitamin (MULTI-VITAMIN PO) Take 1 tablet by mouth daily.    [provider]  OMEGA-3 FATTY ACIDS PO Take 2 capsules by mouth daily.    [provider]    Family History Family History  Problem Relation Age of Onset   Stroke Mother    Stroke Father    Stroke Sister    Cancer Brother    Pancreatic cancer Brother    Colon cancer Neg Hx    Colon polyps Neg Hx    Esophageal cancer Neg Hx    Rectal cancer Neg Hx    Stomach cancer Neg Hx     Social History Social History   Tobacco Use   Smoking status: Never   Smokeless tobacco: Never  Vaping Use   Vaping  Use: Never used  Substance Use Topics   Alcohol use: No   Drug use: No     Allergies   Wasp venom   Review of Systems Review of Systems  HENT:  Positive for sore throat.   Respiratory:  Positive for cough.   All other systems reviewed and are negative.    Physical Exam Triage Vital Signs ED Triage Vitals  Enc Vitals Group     BP 05/01/22 1321 126/85     Pulse Rate 05/01/22 1321 63     Resp 05/01/22 1321 18     Temp 05/01/22 1321 97.9 F (36.6 C)     Temp Source 05/01/22 1321 Oral     SpO2 05/01/22 1321 97 %     Weight --      Height --      Head Circumference --      Peak Flow --      Pain Score 05/01/22 1320 5     Pain Loc --      Pain Edu? --      Excl. in [place]? --    No data found.  Updated Vital Signs BP 126/85 (BP Location: Left Arm)  Pulse 63   Temp 97.9 F (36.6 C) (Oral)   Resp 18   SpO2 97%   Visual Acuity Right Eye Distance:   Left Eye Distance:   Bilateral Distance:    Right Eye Near:   Left Eye Near:    Bilateral Near:     Physical Exam Vitals and nursing note reviewed.  Constitutional:      Appearance: He is well-developed.  HENT:     Head: Normocephalic.     Mouth/Throat:     Mouth: Mucous membranes are moist.     Pharynx: No pharyngeal swelling or posterior oropharyngeal erythema.  Pulmonary:     Effort: Pulmonary effort is normal.  Abdominal:     General: There is no distension.  Musculoskeletal:        General: Normal range of motion.     Cervical back: Normal range of motion.  Neurological:     Mental Status: He is alert and oriented to person, place, and time.      UC Treatments / Results  Labs (all labs ordered are listed, but only abnormal results are displayed) Labs Reviewed - No data to display  EKG   Radiology No results found.  Procedures Procedures (including critical care time)  Medications Ordered in UC Medications - No data to display  Initial Impression / Assessment and Plan / UC Course  I  have reviewed the triage vital signs and the nursing notes.  Pertinent labs & imaging results that were available during my care of the patient were reviewed by me and considered in my medical decision making (see chart for details).      Final Clinical Impressions(s) / UC Diagnoses   Final diagnoses:  Viral URI     Discharge Instructions      Return if any problems.    ED Prescriptions   None    PDMP not reviewed this encounter. ,AVS       Fransico Meadow, [place] 05/05/22 0010

## 2022-06-07 ENCOUNTER — Encounter: Payer: Self-pay | Admitting: Emergency Medicine

## 2022-06-07 ENCOUNTER — Ambulatory Visit (INDEPENDENT_AMBULATORY_CARE_PROVIDER_SITE_OTHER): Payer: BC Managed Care – PPO | Admitting: Emergency Medicine

## 2022-06-07 VITALS — BP 118/74 | HR 59 | Temp 98.4°F | Ht 71.0 in | Wt 191.4 lb

## 2022-06-07 DIAGNOSIS — Z13220 Encounter for screening for lipoid disorders: Secondary | ICD-10-CM | POA: Diagnosis not present

## 2022-06-07 DIAGNOSIS — Z0000 Encounter for general adult medical examination without abnormal findings: Secondary | ICD-10-CM

## 2022-06-07 DIAGNOSIS — Z13228 Encounter for screening for other metabolic disorders: Secondary | ICD-10-CM

## 2022-06-07 DIAGNOSIS — Z125 Encounter for screening for malignant neoplasm of prostate: Secondary | ICD-10-CM | POA: Diagnosis not present

## 2022-06-07 DIAGNOSIS — Z130 Encounter for screening for diseases of the blood and blood-forming organs and certain disorders involving the immune mechanism: Secondary | ICD-10-CM

## 2022-06-07 DIAGNOSIS — Z1329 Encounter for screening for other suspected endocrine disorder: Secondary | ICD-10-CM

## 2022-06-07 LAB — LIPID PANEL
Cholesterol: 187 mg/dL (ref 0–200)
HDL: 53.8 mg/dL (ref >39.00)
LDL Cholesterol: 121 mg/dL — ABNORMAL HIGH (ref 0–99)
NonHDL: 133.46
Total CHOL/HDL Ratio: 3
Triglycerides: 62 mg/dL (ref 0.0–149.0)
VLDL: 12.4 mg/dL (ref 0.0–40.0)

## 2022-06-07 LAB — CBC WITH DIFFERENTIAL/PLATELET
Basophils Absolute: 0 10*3/uL (ref 0.0–0.1)
Basophils Relative: 0.8 % (ref 0.0–3.0)
Eosinophils Absolute: 0.1 10*3/uL (ref 0.0–0.7)
Eosinophils Relative: 2.8 % (ref 0.0–5.0)
HCT: 45.8 % (ref 39.0–52.0)
Hemoglobin: 15.4 g/dL (ref 13.0–17.0)
Lymphocytes Relative: 36.1 % (ref 12.0–46.0)
Lymphs Abs: 1.3 10*3/uL (ref 0.7–4.0)
MCHC: 33.7 g/dL (ref 30.0–36.0)
MCV: 89.3 fl (ref 78.0–100.0)
Monocytes Absolute: 0.4 10*3/uL (ref 0.1–1.0)
Monocytes Relative: 10.3 % (ref 3.0–12.0)
Neutro Abs: 1.9 10*3/uL (ref 1.4–7.7)
Neutrophils Relative %: 50 % (ref 43.0–77.0)
Platelets: 233 10*3/uL (ref 150.0–400.0)
RBC: 5.13 Mil/uL (ref 4.22–5.81)
RDW: 12.8 % (ref 11.5–15.5)
WBC: 3.7 10*3/uL — ABNORMAL LOW (ref 4.0–10.5)

## 2022-06-07 LAB — COMPREHENSIVE METABOLIC PANEL
ALT: 13 U/L (ref 0–53)
AST: 25 U/L (ref 0–37)
Albumin: 4.9 g/dL (ref 3.5–5.2)
Alkaline Phosphatase: 89 U/L (ref 39–117)
BUN: 7 mg/dL (ref 6–23)
CO2: 30 mEq/L (ref 19–32)
Calcium: 9.8 mg/dL (ref 8.4–10.5)
Chloride: 94 mEq/L — ABNORMAL LOW (ref 96–112)
Creatinine, Ser: 0.95 mg/dL (ref 0.40–1.50)
GFR: 91.97 mL/min (ref >60.00)
Glucose, Bld: 85 mg/dL (ref 70–99)
Potassium: 5.1 mEq/L (ref 3.5–5.1)
Sodium: 136 mEq/L (ref 135–145)
Total Bilirubin: 0.9 mg/dL (ref 0.2–1.2)
Total Protein: 7.4 g/dL (ref 6.0–8.3)

## 2022-06-07 LAB — HEMOGLOBIN A1C: Hgb A1c MFr Bld: 5.5 % (ref 4.6–6.5)

## 2022-06-07 LAB — PSA: PSA: 0.72 ng/mL (ref 0.10–4.00)

## 2022-06-07 NOTE — Patient Instructions (Signed)
Health Maintenance, Male Adopting a healthy lifestyle and getting preventive care are important in promoting health and wellness. Ask your health care provider about: The right schedule for you to have regular tests and exams. Things you can do on your own to prevent diseases and keep yourself healthy. What should I know about diet, weight, and exercise? Eat a healthy diet  Eat a diet that includes plenty of vegetables, fruits, low-fat dairy products, and lean protein. Do not eat a lot of foods that are high in solid fats, added sugars, or sodium. Maintain a healthy weight Body mass index (BMI) is a measurement that can be used to identify possible weight problems. It estimates body fat based on height and weight. Your health care provider can help determine your BMI and help you achieve or maintain a healthy weight. Get regular exercise Get regular exercise. This is one of the most important things you can do for your health. Most adults should: Exercise for at least 150 minutes each week. The exercise should increase your heart rate and make you sweat (moderate-intensity exercise). Do strengthening exercises at least twice a week. This is in addition to the moderate-intensity exercise. Spend less time sitting. Even light physical activity can be beneficial. Watch cholesterol and blood lipids Have your blood tested for lipids and cholesterol at 53 years of age, then have this test every 5 years. You may need to have your cholesterol levels checked more often if: Your lipid or cholesterol levels are high. You are older than 53 years of age. You are at high risk for heart disease. What should I know about cancer screening? Many types of cancers can be detected early and may often be prevented. Depending on your health history and family history, you may need to have cancer screening at various ages. This may include screening for: Colorectal cancer. Prostate cancer. Skin cancer. Lung  cancer. What should I know about heart disease, diabetes, and high blood pressure? Blood pressure and heart disease High blood pressure causes heart disease and increases the risk of stroke. This is more likely to develop in people who have high blood pressure readings or are overweight. Talk with your health care provider about your target blood pressure readings. Have your blood pressure checked: Every 3-5 years if you are 18-39 years of age. Every year if you are 40 years old or older. If you are between the ages of 65 and 75 and are a current or former smoker, ask your health care provider if you should have a one-time screening for abdominal aortic aneurysm (AAA). Diabetes Have regular diabetes screenings. This checks your fasting blood sugar level. Have the screening done: Once every three years after age 45 if you are at a normal weight and have a low risk for diabetes. More often and at a younger age if you are overweight or have a high risk for diabetes. What should I know about preventing infection? Hepatitis B If you have a higher risk for hepatitis B, you should be screened for this virus. Talk with your health care provider to find out if you are at risk for hepatitis B infection. Hepatitis C Blood testing is recommended for: Everyone born from 1945 through 1965. Anyone with known risk factors for hepatitis C. Sexually transmitted infections (STIs) You should be screened each year for STIs, including gonorrhea and chlamydia, if: You are sexually active and are younger than 53 years of age. You are older than 53 years of age and your   health care provider tells you that you are at risk for this type of infection. Your sexual activity has changed since you were last screened, and you are at increased risk for chlamydia or gonorrhea. Ask your health care provider if you are at risk. Ask your health care provider about whether you are at high risk for HIV. Your health care provider  may recommend a prescription medicine to help prevent HIV infection. If you choose to take medicine to prevent HIV, you should first get tested for HIV. You should then be tested every 3 months for as long as you are taking the medicine. Follow these instructions at home: Alcohol use Do not drink alcohol if your health care provider tells you not to drink. If you drink alcohol: Limit how much you have to 0-2 drinks a day. Know how much alcohol is in your drink. In the [place] one drink equals one 12 oz bottle of beer (355 mL), one 5 oz glass of wine (148 mL), or one 1 oz glass of hard liquor (44 mL). Lifestyle Do not use any products that contain nicotine or tobacco. These products include cigarettes, chewing tobacco, and vaping devices, such as e-cigarettes. If you need help quitting, ask your health care provider. Do not use street drugs. Do not share needles. Ask your health care provider for help if you need support or information about quitting drugs. General instructions Schedule regular health, dental, and eye exams. Stay current with your vaccines. Tell your health care provider if: You often feel depressed. You have ever been abused or do not feel safe at home. Summary Adopting a healthy lifestyle and getting preventive care are important in promoting health and wellness. Follow your health care provider's instructions about healthy diet, exercising, and getting tested or screened for diseases. Follow your health care provider's instructions on monitoring your cholesterol and blood pressure. This information is not intended to replace advice given to you by your health care provider. Make sure you discuss any questions you have with your health care provider. Document Revised: 10/04/2020 Document Reviewed: 10/04/2020 Elsevier Patient Education  2023 [employer]  

## 2022-06-07 NOTE — Progress Notes (Signed)
Antonio Moore 52 y.o.   Chief Complaint  Patient presents with   Annual Exam    No concerns     HISTORY OF PRESENT ILLNESS: This is a 52 y.o. male here for annual exam. Healthy male with health the lifestyle. Has no complaints or medical concerns today.  HPI   Prior to Admission medications   Medication Sig Start Date End Date Taking? Authorizing Provider  Multiple Vitamin (MULTI-VITAMIN PO) Take 1 tablet by mouth daily.   Yes [provider]  OMEGA-3 FATTY ACIDS PO Take 2 capsules by mouth daily.   Yes [provider]    Allergies  Allergen Reactions   Wasp Venom Swelling    Patient Active Problem List   Diagnosis Date Noted   Premature ventricular contractions (PVCs) (VPCs) 03/10/2020    Past Medical History:  Diagnosis Date   Anemia    late teens , early 20's     Past Surgical History:  Procedure Laterality Date   NO PAST SURGERIES     OPEN REDUCTION INTERNAL FIXATION (ORIF) DISTAL RADIAL FRACTURE Left 11/19/2020   Procedure: OPEN REDUCTION INTERNAL FIXATION (ORIF) DISTAL RADIUS FRACTURE;  Surgeon: Meredith Pel, MD;  Location: [place];  Service: Orthopedics;  Laterality: Left;   WISDOM TOOTH EXTRACTION      Social History   Socioeconomic History   Marital status: Unknown    Spouse name: Not on file   Number of children: Not on file   Years of education: Not on file   Highest education level: Not on file  Occupational History   Not on file  Tobacco Use   Smoking status: Never   Smokeless tobacco: Never  Vaping Use   Vaping Use: Never used  Substance and Sexual Activity   Alcohol use: No   Drug use: No   Sexual activity: Not on file  Other Topics Concern   Not on file  Social History Narrative   ** Merged History Encounter **       Social Determinants of Health   Financial Resource Strain: Not on file  Food Insecurity: Not on file  Transportation Needs: Not on file  Physical Activity: Not on file  Stress: Not  on file  Social Connections: Not on file  Intimate Partner Violence: Not on file    Family History  Problem Relation Age of Onset   Stroke Mother    Stroke Father    Stroke Sister    Cancer Brother    Pancreatic cancer Brother    Colon cancer Neg Hx    Colon polyps Neg Hx    Esophageal cancer Neg Hx    Rectal cancer Neg Hx    Stomach cancer Neg Hx      Review of Systems  Constitutional: Negative.  Negative for chills and fever.  HENT: Negative.  Negative for congestion and sore throat.   Respiratory: Negative.  Negative for cough and shortness of breath.   Cardiovascular: Negative.  Negative for chest pain and palpitations.  Gastrointestinal:  Negative for abdominal pain, nausea and vomiting.  Genitourinary: Negative.  Negative for dysuria and hematuria.  Skin: Negative.  Negative for rash.  Neurological: Negative.  Negative for dizziness and headaches.  All other systems reviewed and are negative.  Today's Vitals   06/07/22 1306  BP: 118/74  Pulse: (!) 59  Temp: 98.4 F (36.9 C)  TempSrc: Oral  SpO2: 96%  Weight: 191 lb 6 oz (86.8 kg)  Height: '5\' 11"'$  (1.803 m)  Body mass index is 26.69 kg/m.   Physical Exam Vitals reviewed.  Constitutional:      Appearance: Normal appearance.  HENT:     Head: Normocephalic.     Right Ear: Tympanic membrane, ear canal and external ear normal.     Left Ear: Tympanic membrane, ear canal and external ear normal.     Mouth/Throat:     Mouth: Mucous membranes are moist.     Pharynx: Oropharynx is clear.  Eyes:     Extraocular Movements: Extraocular movements intact.     Conjunctiva/sclera: Conjunctivae normal.     Pupils: Pupils are equal, round, and reactive to light.  Cardiovascular:     Rate and Rhythm: Normal rate and regular rhythm.     Pulses: Normal pulses.     Heart sounds: Normal heart sounds.  Pulmonary:     Effort: Pulmonary effort is normal.     Breath sounds: Normal breath sounds.  Abdominal:      Palpations: Abdomen is soft.     Tenderness: There is no abdominal tenderness.  Musculoskeletal:        General: Normal range of motion.     Cervical back: No tenderness.  Lymphadenopathy:     Cervical: No cervical adenopathy.  Skin:    General: Skin is warm and dry.     Capillary Refill: Capillary refill takes less than 2 seconds.  Neurological:     Mental Status: He is alert and oriented to person, place, and time.  Psychiatric:        Mood and Affect: Mood normal.        Behavior: Behavior normal.      ASSESSMENT & PLAN: Problem List Items Addressed This Visit   None Visit Diagnoses     Routine general medical examination at a health care facility    -  Primary   Relevant Orders   CBC with Differential   Comprehensive metabolic panel   Hemoglobin A1c   Lipid panel   PSA(Must document that pt has been informed of limitations of PSA testing.)   Prostate cancer screening       Relevant Orders   PSA(Must document that pt has been informed of limitations of PSA testing.)   Screening for deficiency anemia       Relevant Orders   CBC with Differential   Screening for lipoid disorders       Relevant Orders   Lipid panel   Screening for endocrine, metabolic and immunity disorder       Relevant Orders   Comprehensive metabolic panel   Hemoglobin A1c      Modifiable risk factors discussed with patient. Anticipatory guidance according to age provided. The following topics were also discussed: Social Determinants of Health Smoking.  Non-smoker Diet and nutrition Benefits of exercise Cancer screening and review of colonoscopy report from 2022 Vaccinations reviewed and recommendations Cardiovascular risk assessment The 10-year ASCVD risk score (Arnett DK, et al., 2019) is: 4.2%   Values used to calculate the score:     Age: 45 years     Sex: Male     Is Non-Hispanic African American: Yes     Diabetic: No     Tobacco smoker: No     Systolic Blood Pressure: 009  mmHg     Is BP treated: No     HDL Cholesterol: 63.4 mg/dL     Total Cholesterol: 140 mg/dL  Mental health including depression and anxiety Fall and accident prevention  Patient Instructions  Health Maintenance, Male Adopting a healthy lifestyle and getting preventive care are important in promoting health and wellness. Ask your health care provider about: The right schedule for you to have regular tests and exams. Things you can do on your own to prevent diseases and keep yourself healthy. What should I know about diet, weight, and exercise? Eat a healthy diet  Eat a diet that includes plenty of vegetables, fruits, low-fat dairy products, and lean protein. Do not eat a lot of foods that are high in solid fats, added sugars, or sodium. Maintain a healthy weight Body mass index (BMI) is a measurement that can be used to identify possible weight problems. It estimates body fat based on height and weight. Your health care provider can help determine your BMI and help you achieve or maintain a healthy weight. Get regular exercise Get regular exercise. This is one of the most important things you can do for your health. Most adults should: Exercise for at least 150 minutes each week. The exercise should increase your heart rate and make you sweat (moderate-intensity exercise). Do strengthening exercises at least twice a week. This is in addition to the moderate-intensity exercise. Spend less time sitting. Even light physical activity can be beneficial. Watch cholesterol and blood lipids Have your blood tested for lipids and cholesterol at 53 years of age, then have this test every 5 years. You may need to have your cholesterol levels checked more often if: Your lipid or cholesterol levels are high. You are older than 53 years of age. You are at high risk for heart disease. What should I know about cancer screening? Many types of cancers can be detected early and may often be prevented.  Depending on your health history and family history, you may need to have cancer screening at various ages. This may include screening for: Colorectal cancer. Prostate cancer. Skin cancer. Lung cancer. What should I know about heart disease, diabetes, and high blood pressure? Blood pressure and heart disease High blood pressure causes heart disease and increases the risk of stroke. This is more likely to develop in people who have high blood pressure readings or are overweight. Talk with your health care provider about your target blood pressure readings. Have your blood pressure checked: Every 3-5 years if you are 79-84 years of age. Every year if you are 48 years old or older. If you are between the ages of 36 and 46 and are a current or former smoker, ask your health care provider if you should have a one-time screening for abdominal aortic aneurysm (AAA). Diabetes Have regular diabetes screenings. This checks your fasting blood sugar level. Have the screening done: Once every three years after age 77 if you are at a normal weight and have a low risk for diabetes. More often and at a younger age if you are overweight or have a high risk for diabetes. What should I know about preventing infection? Hepatitis B If you have a higher risk for hepatitis B, you should be screened for this virus. Talk with your health care provider to find out if you are at risk for hepatitis B infection. Hepatitis C Blood testing is recommended for: Everyone born from [birth year] through 1965. Anyone with known risk factors for hepatitis C. Sexually transmitted infections (STIs) You should be screened each year for STIs, including gonorrhea and chlamydia, if: You are sexually active and are younger than 53 years of age. You are older than 53 years of age and your  health care provider tells you that you are at risk for this type of infection. Your sexual activity has changed since you were last screened, and you are  at increased risk for chlamydia or gonorrhea. Ask your health care provider if you are at risk. Ask your health care provider about whether you are at high risk for HIV. Your health care provider may recommend a prescription medicine to help prevent HIV infection. If you choose to take medicine to prevent HIV, you should first get tested for HIV. You should then be tested every 3 months for as long as you are taking the medicine. Follow these instructions at home: Alcohol use Do not drink alcohol if your health care provider tells you not to drink. If you drink alcohol: Limit how much you have to 0-2 drinks a day. Know how much alcohol is in your drink. In the [place] one drink equals one 12 oz bottle of beer (355 mL), one 5 oz glass of wine (148 mL), or one 1 oz glass of hard liquor (44 mL). Lifestyle Do not use any products that contain nicotine or tobacco. These products include cigarettes, chewing tobacco, and vaping devices, such as e-cigarettes. If you need help quitting, ask your health care provider. Do not use street drugs. Do not share needles. Ask your health care provider for help if you need support or information about quitting drugs. General instructions Schedule regular health, dental, and eye exams. Stay current with your vaccines. Tell your health care provider if: You often feel depressed. You have ever been abused or do not feel safe at home. Summary Adopting a healthy lifestyle and getting preventive care are important in promoting health and wellness. Follow your health care provider's instructions about healthy diet, exercising, and getting tested or screened for diseases. Follow your health care provider's instructions on monitoring your cholesterol and blood pressure. This information is not intended to replace advice given to you by your health care provider. Make sure you discuss any questions you have with your health care provider. Document Revised: 10/04/2020  Document Reviewed: 10/04/2020 Elsevier Patient Education  [place], MD [facility]

## 2023-01-22 ENCOUNTER — Ambulatory Visit (INDEPENDENT_AMBULATORY_CARE_PROVIDER_SITE_OTHER): Payer: BC Managed Care – PPO | Admitting: Internal Medicine

## 2023-01-22 ENCOUNTER — Encounter: Payer: Self-pay | Admitting: Internal Medicine

## 2023-01-22 VITALS — BP 110/74 | HR 57 | Temp 98.2°F | Ht 71.0 in | Wt 217.0 lb

## 2023-01-22 DIAGNOSIS — M5412 Radiculopathy, cervical region: Secondary | ICD-10-CM

## 2023-01-22 MED ORDER — PREDNISONE 10 MG PO TABS: ORAL_TABLET | ORAL | 0 refills | Status: DC

## 2023-01-22 MED ORDER — GABAPENTIN 100 MG PO CAPS: 100.0000 mg | ORAL_CAPSULE | Freq: Three times a day (TID) | ORAL | 3 refills | Status: DC

## 2023-01-22 NOTE — Progress Notes (Signed)
Patient ID: Antonio Moore, male   DOB: September 22, 1970, 52 y.o.   MRN: [id number]        Chief Complaint: follow up new onset right neck, upper back chest and right arm pain and numbness, mild loss of grip strength       HPI:  Antonio Moore is a 52 y.o. male here with c/o above x 3 wks after lifting weights; Pt denies chest pain, increased sob or doe, wheezing, orthopnea, PND, increased LE swelling, palpitations, dizziness or syncope.   Pt denies polydipsia, polyuria       Wt Readings from Last 3 Encounters:  01/22/23 217 lb (98.4 kg)  06/07/22 191 lb 6 oz (86.8 kg)  06/06/21 181 lb (82.1 kg)   BP Readings from Last 3 Encounters:  01/22/23 110/74  06/07/22 118/74  05/01/22 126/85         Past Medical History:  Diagnosis Date   Anemia    late teens , early 20's    Past Surgical History:  Procedure Laterality Date   NO PAST SURGERIES     OPEN REDUCTION INTERNAL FIXATION (ORIF) DISTAL RADIAL FRACTURE Left 11/19/2020   Procedure: OPEN REDUCTION INTERNAL FIXATION (ORIF) DISTAL RADIUS FRACTURE;  Surgeon: Cammy Copa, MD;  Location: [facility];  Service: Orthopedics;  Laterality: Left;   WISDOM TOOTH EXTRACTION      reports that he has never smoked. He has never used smokeless tobacco. He reports that he does not drink alcohol and does not use drugs. family history includes Cancer in his brother; Pancreatic cancer in his brother; Stroke in his father, mother, and sister. Allergies  Allergen Reactions   Wasp Venom Swelling   Current Outpatient Medications on File Prior to Visit  Medication Sig Dispense Refill   Multiple Vitamin (MULTI-VITAMIN PO) Take 1 tablet by mouth daily. (Patient not taking: Reported on 01/22/2023)     OMEGA-3 FATTY ACIDS PO Take 2 capsules by mouth daily. (Patient not taking: Reported on 01/22/2023)     No current facility-administered medications on file prior to visit.        ROS:  All others reviewed and negative.  Objective        PE:  BP  110/74 (BP Location: Left Arm, Patient Position: Sitting, Cuff Size: Normal)   Pulse (!) 57   Temp 98.2 F (36.8 C) (Oral)   Ht 5\' 11"  (1.803 m)   Wt 217 lb (98.4 kg)   SpO2 96%   BMI 30.27 kg/m                 Constitutional: Pt appears in NAD               HENT: Head: NCAT.                Right Ear: External ear normal.                 Left Ear: External ear normal.                Eyes: . Pupils are equal, round, and reactive to light. Conjunctivae and EOM are normal               Nose: without d/c or deformity               Neck: Neck supple. Gross normal ROM               Cardiovascular: Normal rate and regular rhythm.  Pulmonary/Chest: Effort normal and breath sounds without rales or wheezing.                Abd:  Soft, NT, ND, + BS, no organomegaly               Neurological: Pt is alert. At baseline orientation, motor with 4.5 RUE weakness, some tender skin areas to left trapezoid, left upper chest and diffuse arm; no rash               Skin: Skin is warm. No rashes, no other new lesions, LE edema - none               Psychiatric: Pt behavior is normal without agitation   Micro: none  Cardiac tracings I have personally interpreted today:  none  Pertinent Radiological findings (summarize): none   Lab Results  Component Value Date   WBC 3.7 (L) 06/07/2022   HGB 15.4 06/07/2022   HCT 45.8 06/07/2022   PLT 233.0 06/07/2022   GLUCOSE 85 06/07/2022   CHOL 187 06/07/2022   TRIG 62.0 06/07/2022   HDL 53.80 06/07/2022   LDLCALC 121 (H) 06/07/2022   ALT 13 06/07/2022   AST 25 06/07/2022   NA 136 06/07/2022   K 5.1 06/07/2022   CL 94 (L) 06/07/2022   CREATININE 0.95 06/07/2022   BUN 7 06/07/2022   CO2 30 06/07/2022   TSH 3.260 03/10/2020   PSA 0.72 06/07/2022   HGBA1C 5.5 06/07/2022   Assessment/Plan:  Antonio Moore is a 52 y.o. Black or African American [2] male with  has a past medical history of Anemia.  Right cervical radiculopathy Mild  to mod, new onset, for gabapentin 100 tid, prednsione taper, refer PT and  to f/u any worsening symptoms or concerns, consider MRI for persistence or wosening  Followup: Return if symptoms worsen or fail to improve.  Oliver Barre, MD 01/22/2023 11:47 AM [facility]

## 2023-01-22 NOTE — Assessment & Plan Note (Signed)
Mild to mod, new onset, for gabapentin 100 tid, prednsione taper, refer PT and  to f/u any worsening symptoms or concerns, consider MRI for persistence or wosening

## 2023-01-22 NOTE — Patient Instructions (Signed)
Please take all new medication as prescribed - the gabapentin for nerve pain, and prednisone to reduce inflammation  You will be contacted regarding the referral for: Physical Therapy  Please continue all other medications as before, and refills have been done if requested.  Please have the pharmacy call with any other refills you may need.  Please keep your appointments with your specialists as you may have planned  Please see Dr Alvy Bimler if not improved in 2-4 wks, or sooner if needed

## 2023-02-13 ENCOUNTER — Other Ambulatory Visit: Payer: Self-pay

## 2023-02-13 ENCOUNTER — Encounter: Payer: Self-pay | Admitting: Physical Therapy

## 2023-02-13 ENCOUNTER — Ambulatory Visit: Payer: BC Managed Care – PPO | Attending: Internal Medicine | Admitting: Physical Therapy

## 2023-02-13 DIAGNOSIS — R208 Other disturbances of skin sensation: Secondary | ICD-10-CM

## 2023-02-13 DIAGNOSIS — M5412 Radiculopathy, cervical region: Secondary | ICD-10-CM | POA: Insufficient documentation

## 2023-02-13 DIAGNOSIS — M79601 Pain in right arm: Secondary | ICD-10-CM

## 2023-02-13 DIAGNOSIS — M6281 Muscle weakness (generalized): Secondary | ICD-10-CM

## 2023-02-13 NOTE — Patient Instructions (Signed)
Access Code: [email or web address] URL: [email or web address] Date: 02/13/2023 Prepared by: Rosana Hoes  Exercises - Ulnar Nerve Flossing  - 2-3 x daily - 10 reps - Median Nerve Flossing - Tray  - 2-3 x daily - 10 reps - Seated Cervical Sidebending Stretch  - 2 x daily - 3 reps - 20 seconds hold - Seated Levator Scapulae Stretch  - 2 x daily - 3 reps - 20 seconds hold

## 2023-02-27 ENCOUNTER — Ambulatory Visit: Payer: BC Managed Care – PPO | Admitting: Physical Therapy

## 2023-03-06 ENCOUNTER — Ambulatory Visit: Payer: BC Managed Care – PPO | Admitting: Physical Therapy

## 2023-03-13 ENCOUNTER — Ambulatory Visit: Payer: BC Managed Care – PPO | Admitting: Physical Therapy

## 2023-06-28 ENCOUNTER — Encounter: Payer: Self-pay | Admitting: Emergency Medicine

## 2023-06-28 ENCOUNTER — Ambulatory Visit: Payer: BC Managed Care – PPO | Admitting: Emergency Medicine

## 2023-06-28 VITALS — BP 126/78 | HR 73 | Temp 98.3°F | Ht 71.0 in | Wt 214.6 lb

## 2023-06-28 DIAGNOSIS — Z13220 Encounter for screening for lipoid disorders: Secondary | ICD-10-CM | POA: Diagnosis not present

## 2023-06-28 DIAGNOSIS — Z125 Encounter for screening for malignant neoplasm of prostate: Secondary | ICD-10-CM

## 2023-06-28 DIAGNOSIS — Z1329 Encounter for screening for other suspected endocrine disorder: Secondary | ICD-10-CM | POA: Diagnosis not present

## 2023-06-28 DIAGNOSIS — Z0000 Encounter for general adult medical examination without abnormal findings: Secondary | ICD-10-CM

## 2023-06-28 DIAGNOSIS — Z13228 Encounter for screening for other metabolic disorders: Secondary | ICD-10-CM | POA: Diagnosis not present

## 2023-06-28 DIAGNOSIS — Z130 Encounter for screening for diseases of the blood and blood-forming organs and certain disorders involving the immune mechanism: Secondary | ICD-10-CM

## 2023-06-28 LAB — COMPREHENSIVE METABOLIC PANEL
ALT: 13 U/L (ref 0–53)
AST: 29 U/L (ref 0–37)
Albumin: 4.8 g/dL (ref 3.5–5.2)
Alkaline Phosphatase: 91 U/L (ref 39–117)
BUN: 5 mg/dL — ABNORMAL LOW (ref 6–23)
CO2: 32 meq/L (ref 19–32)
Calcium: 9.4 mg/dL (ref 8.4–10.5)
Chloride: 96 meq/L (ref 96–112)
Creatinine, Ser: 0.95 mg/dL (ref 0.40–1.50)
GFR: 91.29 mL/min (ref >60.00)
Glucose, Bld: 72 mg/dL (ref 70–99)
Potassium: 4.3 meq/L (ref 3.5–5.1)
Sodium: 137 meq/L (ref 135–145)
Total Bilirubin: 0.5 mg/dL (ref 0.2–1.2)
Total Protein: 6.7 g/dL (ref 6.0–8.3)

## 2023-06-28 LAB — CBC WITH DIFFERENTIAL/PLATELET
Basophils Absolute: 0 10*3/uL (ref 0.0–0.1)
Basophils Relative: 0.9 % (ref 0.0–3.0)
Eosinophils Absolute: 0.1 10*3/uL (ref 0.0–0.7)
Eosinophils Relative: 2.8 % (ref 0.0–5.0)
HCT: 43.4 % (ref 39.0–52.0)
Hemoglobin: 14.4 g/dL (ref 13.0–17.0)
Lymphocytes Relative: 26.4 % (ref 12.0–46.0)
Lymphs Abs: 1.1 10*3/uL (ref 0.7–4.0)
MCHC: 33.1 g/dL (ref 30.0–36.0)
MCV: 89.9 fL (ref 78.0–100.0)
Monocytes Absolute: 0.4 10*3/uL (ref 0.1–1.0)
Monocytes Relative: 9.5 % (ref 3.0–12.0)
Neutro Abs: 2.6 10*3/uL (ref 1.4–7.7)
Neutrophils Relative %: 60.4 % (ref 43.0–77.0)
Platelets: 210 10*3/uL (ref 150.0–400.0)
RBC: 4.83 Mil/uL (ref 4.22–5.81)
RDW: 13 % (ref 11.5–15.5)
WBC: 4.2 10*3/uL (ref 4.0–10.5)

## 2023-06-28 LAB — LIPID PANEL
Cholesterol: 137 mg/dL (ref 0–200)
HDL: 46.6 mg/dL (ref >39.00)
LDL Cholesterol: 75 mg/dL (ref 0–99)
NonHDL: 90.23
Total CHOL/HDL Ratio: 3
Triglycerides: 76 mg/dL (ref 0.0–149.0)
VLDL: 15.2 mg/dL (ref 0.0–40.0)

## 2023-06-28 LAB — PSA: PSA: 0.83 ng/mL (ref 0.10–4.00)

## 2023-06-28 LAB — HEMOGLOBIN A1C: Hgb A1c MFr Bld: 5.7 % (ref 4.6–6.5)

## 2023-06-28 NOTE — Patient Instructions (Signed)
Health Maintenance, Male  Adopting a healthy lifestyle and getting preventive care are important in promoting health and wellness. Ask your health care provider about:  The right schedule for you to have regular tests and exams.  Things you can do on your own to prevent diseases and keep yourself healthy.  What should I know about diet, weight, and exercise?  Eat a healthy diet    Eat a diet that includes plenty of vegetables, fruits, low-fat dairy products, and lean protein.  Do not eat a lot of foods that are high in solid fats, added sugars, or sodium.  Maintain a healthy weight  Body mass index (BMI) is a measurement that can be used to identify possible weight problems. It estimates body fat based on height and weight. Your health care provider can help determine your BMI and help you achieve or maintain a healthy weight.  Get regular exercise  Get regular exercise. This is one of the most important things you can do for your health. Most adults should:  Exercise for at least 150 minutes each week. The exercise should increase your heart rate and make you sweat (moderate-intensity exercise).  Do strengthening exercises at least twice a week. This is in addition to the moderate-intensity exercise.  Spend less time sitting. Even light physical activity can be beneficial.  Watch cholesterol and blood lipids  Have your blood tested for lipids and cholesterol at 54 years of age, then have this test every 5 years.  You may need to have your cholesterol levels checked more often if:  Your lipid or cholesterol levels are high.  You are older than 54 years of age.  You are at high risk for heart disease.  What should I know about cancer screening?  Many types of cancers can be detected early and may often be prevented. Depending on your health history and family history, you may need to have cancer screening at various ages. This may include screening for:  Colorectal cancer.  Prostate cancer.  Skin cancer.  Lung  cancer.  What should I know about heart disease, diabetes, and high blood pressure?  Blood pressure and heart disease  High blood pressure causes heart disease and increases the risk of stroke. This is more likely to develop in people who have high blood pressure readings or are overweight.  Talk with your health care provider about your target blood pressure readings.  Have your blood pressure checked:  Every 3-5 years if you are 1-54 years of age.  Every year if you are 54 years old or older.  If you are between the ages of 29 and 4 and are a current or former smoker, ask your health care provider if you should have a one-time screening for abdominal aortic aneurysm (AAA).  Diabetes  Have regular diabetes screenings. This checks your fasting blood sugar level. Have the screening done:  Once every three years after age 40 if you are at a normal weight and have a low risk for diabetes.  More often and at a younger age if you are overweight or have a high risk for diabetes.  What should I know about preventing infection?  Hepatitis B  If you have a higher risk for hepatitis B, you should be screened for this virus. Talk with your health care provider to find out if you are at risk for hepatitis B infection.  Hepatitis C  Blood testing is recommended for:  Everyone born from [birth year] through 1965.  Anyone  with known risk factors for hepatitis C.  Sexually transmitted infections (STIs)  You should be screened each year for STIs, including gonorrhea and chlamydia, if:  You are sexually active and are younger than 54 years of age.  You are older than 54 years of age and your health care provider tells you that you are at risk for this type of infection.  Your sexual activity has changed since you were last screened, and you are at increased risk for chlamydia or gonorrhea. Ask your health care provider if you are at risk.  Ask your health care provider about whether you are at high risk for HIV. Your health care provider  may recommend a prescription medicine to help prevent HIV infection. If you choose to take medicine to prevent HIV, you should first get tested for HIV. You should then be tested every 3 months for as long as you are taking the medicine.  Follow these instructions at home:  Alcohol use  Do not drink alcohol if your health care provider tells you not to drink.  If you drink alcohol:  Limit how much you have to 0-2 drinks a day.  Know how much alcohol is in your drink. In the [place] one drink equals one 12 oz bottle of beer (355 mL), one 5 oz glass of wine (148 mL), or one 1 oz glass of hard liquor (44 mL).  Lifestyle  Do not use any products that contain nicotine or tobacco. These products include cigarettes, chewing tobacco, and vaping devices, such as e-cigarettes. If you need help quitting, ask your health care provider.  Do not use street drugs.  Do not share needles.  Ask your health care provider for help if you need support or information about quitting drugs.  General instructions  Schedule regular health, dental, and eye exams.  Stay current with your vaccines.  Tell your health care provider if:  You often feel depressed.  You have ever been abused or do not feel safe at home.  Summary  Adopting a healthy lifestyle and getting preventive care are important in promoting health and wellness.  Follow your health care provider's instructions about healthy diet, exercising, and getting tested or screened for diseases.  Follow your health care provider's instructions on monitoring your cholesterol and blood pressure.  This information is not intended to replace advice given to you by your health care provider. Make sure you discuss any questions you have with your health care provider.  Document Revised: 10/04/2020 Document Reviewed: 10/04/2020  Elsevier Patient Education  2024 [employer].

## 2023-06-28 NOTE — Progress Notes (Signed)
Antonio Moore 53 y.o.   Chief Complaint  Patient presents with   Annual Exam    Patient states that he had an issue urinating one day that he wants to discuss     HISTORY OF PRESENT ILLNESS: This is a 53 y.o. male here for annual exam. Recently noted some urinary retention after long trip with significant delay starting to urinate Happened twice.  None since.  No other associated symptoms. Healthy male with healthy lifestyle No other complaints or medical concerns today.  HPI   Prior to Admission medications   Medication Sig Start Date End Date Taking? Authorizing Provider  Multiple Vitamin (MULTI-VITAMIN PO) Take 1 tablet by mouth daily. Patient not taking: Reported on 01/22/2023    [provider]  OMEGA-3 FATTY ACIDS PO Take 2 capsules by mouth daily. Patient not taking: Reported on 01/22/2023    [provider]    Allergies  Allergen Reactions   Wasp Venom Swelling    Patient Active Problem List   Diagnosis Date Noted   Right cervical radiculopathy 01/22/2023   Premature ventricular contractions (PVCs) (VPCs) 03/10/2020    Past Medical History:  Diagnosis Date   Anemia    late teens , early 20's     Past Surgical History:  Procedure Laterality Date   NO PAST SURGERIES     OPEN REDUCTION INTERNAL FIXATION (ORIF) DISTAL RADIAL FRACTURE Left 11/19/2020   Procedure: OPEN REDUCTION INTERNAL FIXATION (ORIF) DISTAL RADIUS FRACTURE;  Surgeon: Cammy Copa, MD;  Location: [facility] OR;  Service: Orthopedics;  Laterality: Left;   WISDOM TOOTH EXTRACTION      Social History   Socioeconomic History   Marital status: Unknown    Spouse name: Not on file   Number of children: Not on file   Years of education: Not on file   Highest education level: Bachelor's degree (e.g., BA, AB, BS)  Occupational History   Not on file  Tobacco Use   Smoking status: Never   Smokeless tobacco: Never  Vaping Use   Vaping status: Never Used  Substance and  Sexual Activity   Alcohol use: No   Drug use: No   Sexual activity: Not on file  Other Topics Concern   Not on file  Social History Narrative   ** Merged History Encounter **       Social Drivers of Health   Financial Resource Strain: Low Risk  (06/27/2023)   Overall Financial Resource Strain (CARDIA)    Difficulty of Paying Living Expenses: Not hard at all  Food Insecurity: No Food Insecurity (06/27/2023)   Hunger Vital Sign    Worried About Running Out of Food in the Last Year: Never true    Ran Out of Food in the Last Year: Never true  Transportation Needs: No Transportation Needs (06/27/2023)   PRAPARE - Administrator, Civil Service (Medical): No    Lack of Transportation (Non-Medical): No  Physical Activity: Sufficiently Active (06/27/2023)   Exercise Vital Sign    Days of Exercise per Week: 5 days    Minutes of Exercise per Session: 50 min  Stress: No Stress Concern Present (06/27/2023)   [employer] of Occupational Health - Occupational Stress Questionnaire    Feeling of Stress : Only a little  Social Connections: Socially Integrated (06/27/2023)   Social Connection and Isolation Panel [NHANES]    Frequency of Communication with Friends and Family: Three times a week    Frequency of Social Gatherings with Friends  and Family: Once a week    Attends Religious Services: More than 4 times per year    Active Member of Clubs or Organizations: Yes    Attends Engineer, structural: More than 4 times per year    Marital Status: Married  Catering manager Violence: Not on file    Family History  Problem Relation Age of Onset   Stroke Mother    Stroke Father    Stroke Sister    Cancer Brother    Pancreatic cancer Brother    Colon cancer Neg Hx    Colon polyps Neg Hx    Esophageal cancer Neg Hx    Rectal cancer Neg Hx    Stomach cancer Neg Hx      Review of Systems  Constitutional: Negative.  Negative for chills and fever.  HENT: Negative.   Negative for congestion and sore throat.   Respiratory: Negative.  Negative for cough and shortness of breath.   Cardiovascular: Negative.  Negative for chest pain and palpitations.  Gastrointestinal:  Negative for abdominal pain, diarrhea, nausea and vomiting.  Genitourinary: Negative.  Negative for dysuria, flank pain, frequency, hematuria and urgency.  Musculoskeletal: Negative.   Skin: Negative.  Negative for rash.  Neurological: Negative.  Negative for dizziness and headaches.  All other systems reviewed and are negative.   Vitals:   06/28/23 1514  BP: 126/78  Pulse: 73  Temp: 98.3 F (36.8 C)  SpO2: 94%    Physical Exam Constitutional:      Appearance: Normal appearance.  HENT:     Head: Normocephalic.     Right Ear: Tympanic membrane, ear canal and external ear normal.     Left Ear: Tympanic membrane, ear canal and external ear normal.     Mouth/Throat:     Mouth: Mucous membranes are moist.     Pharynx: Oropharynx is clear.  Eyes:     Extraocular Movements: Extraocular movements intact.     Conjunctiva/sclera: Conjunctivae normal.     Pupils: Pupils are equal, round, and reactive to light.  Cardiovascular:     Rate and Rhythm: Normal rate and regular rhythm.     Pulses: Normal pulses.     Heart sounds: Normal heart sounds.  Pulmonary:     Effort: Pulmonary effort is normal.     Breath sounds: Normal breath sounds.  Abdominal:     Palpations: Abdomen is soft.     Tenderness: There is no abdominal tenderness.  Musculoskeletal:     Cervical back: No tenderness.  Lymphadenopathy:     Cervical: No cervical adenopathy.  Skin:    General: Skin is warm and dry.     Capillary Refill: Capillary refill takes less than 2 seconds.  Neurological:     General: No focal deficit present.     Mental Status: He is alert and oriented to person, place, and time.  Psychiatric:        Mood and Affect: Mood normal.        Behavior: Behavior normal.      ASSESSMENT &  PLAN: Problem List Items Addressed This Visit   None Visit Diagnoses       Routine general medical examination at a health care facility    -  Primary   Relevant Orders   Urinalysis   CBC with Differential/Platelet   PSA   Comprehensive metabolic panel   Hemoglobin A1c   Lipid panel     Screening for deficiency anemia  Relevant Orders   CBC with Differential/Platelet     Screening for lipoid disorders       Relevant Orders   Lipid panel     Screening for endocrine, metabolic and immunity disorder       Relevant Orders   Urinalysis   Comprehensive metabolic panel   Hemoglobin A1c     Screening for prostate cancer       Relevant Orders   PSA      Modifiable risk factors discussed with patient. Anticipatory guidance according to age provided. The following topics were also discussed: Social Determinants of Health Smoking.  Non-smoker Diet and nutrition Benefits of exercise Cancer screening and review of most recent colonoscopy report Vaccinations review and recommendations Cardiovascular risk assessment and need for blood work The 10-year ASCVD risk score (Arnett DK, et al., 2019) is: 5.7%   Values used to calculate the score:     Age: 54 years     Sex: Male     Is Non-Hispanic African American: Yes     Diabetic: No     Tobacco smoker: No     Systolic Blood Pressure: 126 mmHg     Is BP treated: No     HDL Cholesterol: 53.8 mg/dL     Total Cholesterol: 187 mg/dL  Mental health including depression and anxiety Fall and accident prevention  Patient Instructions  Health Maintenance, Male Adopting a healthy lifestyle and getting preventive care are important in promoting health and wellness. Ask your health care provider about: The right schedule for you to have regular tests and exams. Things you can do on your own to prevent diseases and keep yourself healthy. What should I know about diet, weight, and exercise? Eat a healthy diet  Eat a diet that  includes plenty of vegetables, fruits, low-fat dairy products, and lean protein. Do not eat a lot of foods that are high in solid fats, added sugars, or sodium. Maintain a healthy weight Body mass index (BMI) is a measurement that can be used to identify possible weight problems. It estimates body fat based on height and weight. Your health care provider can help determine your BMI and help you achieve or maintain a healthy weight. Get regular exercise Get regular exercise. This is one of the most important things you can do for your health. Most adults should: Exercise for at least 150 minutes each week. The exercise should increase your heart rate and make you sweat (moderate-intensity exercise). Do strengthening exercises at least twice a week. This is in addition to the moderate-intensity exercise. Spend less time sitting. Even light physical activity can be beneficial. Watch cholesterol and blood lipids Have your blood tested for lipids and cholesterol at 54 years of age, then have this test every 5 years. You may need to have your cholesterol levels checked more often if: Your lipid or cholesterol levels are high. You are older than 54 years of age. You are at high risk for heart disease. What should I know about cancer screening? Many types of cancers can be detected early and may often be prevented. Depending on your health history and family history, you may need to have cancer screening at various ages. This may include screening for: Colorectal cancer. Prostate cancer. Skin cancer. Lung cancer. What should I know about heart disease, diabetes, and high blood pressure? Blood pressure and heart disease High blood pressure causes heart disease and increases the risk of stroke. This is more likely to develop in people who  have high blood pressure readings or are overweight. Talk with your health care provider about your target blood pressure readings. Have your blood pressure  checked: Every 3-5 years if you are 39-42 years of age. Every year if you are 61 years old or older. If you are between the ages of 17 and 28 and are a current or former smoker, ask your health care provider if you should have a one-time screening for abdominal aortic aneurysm (AAA). Diabetes Have regular diabetes screenings. This checks your fasting blood sugar level. Have the screening done: Once every three years after age 19 if you are at a normal weight and have a low risk for diabetes. More often and at a younger age if you are overweight or have a high risk for diabetes. What should I know about preventing infection? Hepatitis B If you have a higher risk for hepatitis B, you should be screened for this virus. Talk with your health care provider to find out if you are at risk for hepatitis B infection. Hepatitis C Blood testing is recommended for: Everyone born from [birth year] through 1966. Anyone with known risk factors for hepatitis C. Sexually transmitted infections (STIs) You should be screened each year for STIs, including gonorrhea and chlamydia, if: You are sexually active and are younger than 54 years of age. You are older than 54 years of age and your health care provider tells you that you are at risk for this type of infection. Your sexual activity has changed since you were last screened, and you are at increased risk for chlamydia or gonorrhea. Ask your health care provider if you are at risk. Ask your health care provider about whether you are at high risk for HIV. Your health care provider may recommend a prescription medicine to help prevent HIV infection. If you choose to take medicine to prevent HIV, you should first get tested for HIV. You should then be tested every 3 months for as long as you are taking the medicine. Follow these instructions at home: Alcohol use Do not drink alcohol if your health care provider tells you not to drink. If you drink alcohol: Limit how  much you have to 0-2 drinks a day. Know how much alcohol is in your drink. In the [place] one drink equals one 12 oz bottle of beer (355 mL), one 5 oz glass of wine (148 mL), or one 1 oz glass of hard liquor (44 mL). Lifestyle Do not use any products that contain nicotine or tobacco. These products include cigarettes, chewing tobacco, and vaping devices, such as e-cigarettes. If you need help quitting, ask your health care provider. Do not use street drugs. Do not share needles. Ask your health care provider for help if you need support or information about quitting drugs. General instructions Schedule regular health, dental, and eye exams. Stay current with your vaccines. Tell your health care provider if: You often feel depressed. You have ever been abused or do not feel safe at home. Summary Adopting a healthy lifestyle and getting preventive care are important in promoting health and wellness. Follow your health care provider's instructions about healthy diet, exercising, and getting tested or screened for diseases. Follow your health care provider's instructions on monitoring your cholesterol and blood pressure. This information is not intended to replace advice given to you by your health care provider. Make sure you discuss any questions you have with your health care provider. Document Revised: 10/04/2020 Document Reviewed: 10/04/2020 Elsevier Patient Education  2024  Elsevier Inc.     Edwina Barth, MD [facility]

## 2023-06-29 ENCOUNTER — Encounter: Payer: Self-pay | Admitting: Emergency Medicine

## 2023-06-29 LAB — URINALYSIS, ROUTINE W REFLEX MICROSCOPIC
Bilirubin Urine: NEGATIVE
Hgb urine dipstick: NEGATIVE
Ketones, ur: 15 — AB
Leukocytes,Ua: NEGATIVE
Nitrite: NEGATIVE
RBC / HPF: NONE SEEN (ref 0–?)
Specific Gravity, Urine: 1.01 (ref 1.000–1.030)
Total Protein, Urine: NEGATIVE
Urine Glucose: NEGATIVE
Urobilinogen, UA: 0.2 (ref 0.0–1.0)
pH: 6 (ref 5.0–8.0)

## 2023-06-29 NOTE — Addendum Note (Signed)
Addended by: Aundra Millet on: 06/29/2023 11:33 AM   Modules accepted: Orders

## 2023-07-09 NOTE — Telephone Encounter (Signed)
 No concerns with urine results.  Thanks.

## 2023-11-18 ENCOUNTER — Encounter: Payer: Self-pay | Admitting: Family Medicine

## 2023-11-18 NOTE — Progress Notes (Unsigned)
     Jereld Presti T. Hilbert Briggs, MD, CAQ Sports Medicine [facility] at [facility] [address] [place], [address]  Phone: [phone]  FAX: [phone]  Talton Delpriore - 53 y.o. male  MRN [id number]  Date of Birth: June 09, 1970  Date: 11/21/2023  PCP: Purcell Emil Schanz, MD  Referral: Purcell Emil Schanz, *  No chief complaint on file.  Subjective:   Antonio Moore is a 53 y.o. very pleasant male patient with There is no height or weight on file to calculate BMI. who presents with the following:  This is a very pleasant patient presents for evaluation of ongoing shoulder pain.  No history of prior major injury or operative intervention.    Review of Systems is noted in the HPI, as appropriate  Objective:   There were no vitals taken for this visit.  GEN: No acute distress; alert,appropriate. PULM: Breathing comfortably in no respiratory distress PSYCH: Normally interactive.   Laboratory and Imaging Data:  Assessment and Plan:   ***

## 2023-11-21 ENCOUNTER — Ambulatory Visit
Admission: RE | Admit: 2023-11-21 | Discharge: 2023-11-21 | Disposition: A | Discharge status: Home / Self Care | Source: Ambulatory Visit | Attending: Family Medicine | Admitting: Family Medicine

## 2023-11-21 ENCOUNTER — Ambulatory Visit: Payer: Self-pay | Admitting: Family Medicine

## 2023-11-21 ENCOUNTER — Encounter: Payer: Self-pay | Admitting: Family Medicine

## 2023-11-21 VITALS — BP 112/80 | HR 57 | Temp 97.5°F | Ht 71.0 in | Wt 159.5 lb

## 2023-11-21 DIAGNOSIS — M25512 Pain in left shoulder: Secondary | ICD-10-CM

## 2023-11-21 DIAGNOSIS — G8929 Other chronic pain: Secondary | ICD-10-CM | POA: Diagnosis not present

## 2023-11-27 ENCOUNTER — Ambulatory Visit: Payer: Self-pay | Admitting: Family Medicine

## 2024-01-15 ENCOUNTER — Ambulatory Visit: Admitting: Emergency Medicine

## 2024-01-16 ENCOUNTER — Telehealth: Payer: Self-pay

## 2024-01-16 ENCOUNTER — Ambulatory Visit (INDEPENDENT_AMBULATORY_CARE_PROVIDER_SITE_OTHER)

## 2024-01-16 ENCOUNTER — Ambulatory Visit (INDEPENDENT_AMBULATORY_CARE_PROVIDER_SITE_OTHER): Admitting: Emergency Medicine

## 2024-01-16 ENCOUNTER — Encounter: Payer: Self-pay | Admitting: Emergency Medicine

## 2024-01-16 VITALS — BP 108/64 | HR 48 | Temp 97.8°F | Ht 71.0 in | Wt 149.0 lb

## 2024-01-16 DIAGNOSIS — R198 Other specified symptoms and signs involving the digestive system and abdomen: Secondary | ICD-10-CM

## 2024-01-16 DIAGNOSIS — R634 Abnormal weight loss: Secondary | ICD-10-CM

## 2024-01-16 DIAGNOSIS — K921 Melena: Secondary | ICD-10-CM

## 2024-01-16 LAB — LIPID PANEL
Cholesterol: 165 mg/dL (ref 0–200)
HDL: 75.9 mg/dL (ref >39.00)
LDL Cholesterol: 80 mg/dL (ref 0–99)
NonHDL: 89.12
Total CHOL/HDL Ratio: 2
Triglycerides: 45 mg/dL (ref 0.0–149.0)
VLDL: 9 mg/dL (ref 0.0–40.0)

## 2024-01-16 LAB — CBC WITH DIFFERENTIAL/PLATELET
Basophils Absolute: 0 K/uL (ref 0.0–0.1)
Basophils Relative: 0.9 % (ref 0.0–3.0)
Eosinophils Absolute: 0.1 K/uL (ref 0.0–0.7)
Eosinophils Relative: 2.4 % (ref 0.0–5.0)
HCT: 38.4 % — ABNORMAL LOW (ref 39.0–52.0)
Hemoglobin: 12.9 g/dL — ABNORMAL LOW (ref 13.0–17.0)
Lymphocytes Relative: 36.6 % (ref 12.0–46.0)
Lymphs Abs: 1 K/uL (ref 0.7–4.0)
MCHC: 33.6 g/dL (ref 30.0–36.0)
MCV: 90.9 fl (ref 78.0–100.0)
Monocytes Absolute: 0.2 K/uL (ref 0.1–1.0)
Monocytes Relative: 6.6 % (ref 3.0–12.0)
Neutro Abs: 1.5 K/uL (ref 1.4–7.7)
Neutrophils Relative %: 53.5 % (ref 43.0–77.0)
Platelets: 205 K/uL (ref 150.0–400.0)
RBC: 4.22 Mil/uL (ref 4.22–5.81)
RDW: 13.6 % (ref 11.5–15.5)
WBC: 2.9 K/uL — ABNORMAL LOW (ref 4.0–10.5)

## 2024-01-16 LAB — HEMOGLOBIN A1C: Hgb A1c MFr Bld: 5.6 % (ref 4.6–6.5)

## 2024-01-16 LAB — COMPREHENSIVE METABOLIC PANEL WITH GFR
ALT: 24 U/L (ref 0–53)
AST: 29 U/L (ref 0–37)
Albumin: 4.7 g/dL (ref 3.5–5.2)
Alkaline Phosphatase: 71 U/L (ref 39–117)
BUN: 3 mg/dL — ABNORMAL LOW (ref 6–23)
CO2: 31 meq/L (ref 19–32)
Calcium: 9.3 mg/dL (ref 8.4–10.5)
Chloride: 98 meq/L (ref 96–112)
Creatinine, Ser: 0.77 mg/dL (ref 0.40–1.50)
GFR: 101.71 mL/min (ref >60.00)
Glucose, Bld: 83 mg/dL (ref 70–99)
Potassium: 4.3 meq/L (ref 3.5–5.1)
Sodium: 136 meq/L (ref 135–145)
Total Bilirubin: 0.6 mg/dL (ref 0.2–1.2)
Total Protein: 6.9 g/dL (ref 6.0–8.3)

## 2024-01-16 LAB — PSA: PSA: 1.32 ng/mL (ref 0.10–4.00)

## 2024-01-16 LAB — URINALYSIS
Bilirubin Urine: NEGATIVE
Hgb urine dipstick: NEGATIVE
Ketones, ur: NEGATIVE
Leukocytes,Ua: NEGATIVE
Nitrite: NEGATIVE
Specific Gravity, Urine: <=1.005 — AB (ref 1.000–1.030)
Total Protein, Urine: NEGATIVE
Urine Glucose: NEGATIVE
Urobilinogen, UA: 0.2 (ref 0.0–1.0)
pH: 6.5 (ref 5.0–8.0)

## 2024-01-16 LAB — VITAMIN B12: Vitamin B-12: 327 pg/mL (ref 211–911)

## 2024-01-16 LAB — TESTOSTERONE: Testosterone: 475.95 ng/dL (ref 300.00–890.00)

## 2024-01-16 LAB — VITAMIN D 25 HYDROXY (VIT D DEFICIENCY, FRACTURES): VITD: 33.04 ng/mL (ref 30.00–100.00)

## 2024-01-16 NOTE — Progress Notes (Signed)
 Antonio Moore 53 y.o.   Chief Complaint  Patient presents with   Stool Color Change   Weight Loss    Patient states his symptoms started 12/31/23. Patient states he has been working out more but was not expecting to lose this much weight. Patient states he stools was really hard and dry but last week it has changed to a soft stool, he mentions wiping once and seeing blood.     HISTORY OF PRESENT ILLNESS: This is a 53 y.o. male complaining of significant weight loss over the past couple of months Started working out much more, almost daily since last December.  Was also eating 1 meal a day. Noticed recently change in bowel movement and stool consistency.  Small amount of blood in the stool noticed couple days ago Non-smoker.  Never was. Not taking any medication to lose weight. No history of thyroid  condition. No other complaint or medical concerns today.   Wt Readings from Last 3 Encounters:  01/16/24 149 lb (67.6 kg)  11/21/23 159 lb 8 oz (72.3 kg)  06/28/23 214 lb 9.6 oz (97.3 kg)     HPI   Prior to Admission medications   Medication Sig Start Date End Date Taking? Authorizing Provider  Multiple Vitamin (MULTI-VITAMIN PO) Take 1 tablet by mouth daily.   Yes [provider]  OMEGA-3 FATTY ACIDS PO Take 2 capsules by mouth daily.   Yes [provider]  SODIUM FLUORIDE, DENTAL RINSE, 0.2 % SOLN PLEASE USE 2 TIMES A WEEK AT NIGHT BEFORE BED 12/19/23  Yes [provider]  VITAMIN D  PO Take 1 drop by mouth daily.   Yes [provider]    Allergies  Allergen Reactions   Wasp Venom Swelling    There are no active problems to display for this patient.   Past Medical History:  Diagnosis Date   Anemia    late teens , early 20's     Past Surgical History:  Procedure Laterality Date   OPEN REDUCTION INTERNAL FIXATION (ORIF) DISTAL RADIAL FRACTURE Left 11/19/2020   Procedure: OPEN REDUCTION INTERNAL FIXATION (ORIF) DISTAL RADIUS  FRACTURE;  Surgeon: Addie Cordella Hamilton, MD;  Location: [facility] OR;  Service: Orthopedics;  Laterality: Left;   WISDOM TOOTH EXTRACTION      Social History   Socioeconomic History   Marital status: Married    Spouse name: Not on file   Number of children: Not on file   Years of education: Not on file   Highest education level: Bachelor's degree (e.g., BA, AB, BS)  Occupational History   Not on file  Tobacco Use   Smoking status: Never   Smokeless tobacco: Never  Vaping Use   Vaping status: Never Used  Substance and Sexual Activity   Alcohol use: No   Drug use: No   Sexual activity: Not on file  Other Topics Concern   Not on file  Social History Narrative   ** Merged History Encounter **       Social Drivers of Health   Financial Resource Strain: Low Risk  (11/20/2023)   Overall Financial Resource Strain (CARDIA)    Difficulty of Paying Living Expenses: Not hard at all  Food Insecurity: No Food Insecurity (11/20/2023)   Hunger Vital Sign    Worried About Running Out of Food in the Last Year: Never true    Ran Out of Food in the Last Year: Never true  Transportation Needs: No Transportation Needs (11/20/2023)   PRAPARE - Transportation  Lack of Transportation (Medical): No    Lack of Transportation (Non-Medical): No  Physical Activity: Sufficiently Active (11/20/2023)   Exercise Vital Sign    Days of Exercise per Week: 6 days    Minutes of Exercise per Session: 60 min  Stress: No Stress Concern Present (11/20/2023)   [employer] of Occupational Health - Occupational Stress Questionnaire    Feeling of Stress: Not at all  Social Connections: Socially Integrated (11/20/2023)   Social Connection and Isolation Panel    Frequency of Communication with Friends and Family: More than three times a week    Frequency of Social Gatherings with Friends and Family: More than three times a week    Attends Religious Services: More than 4 times per year    Active Member of [employer] or  Organizations: Yes    Attends Engineer, structural: More than 4 times per year    Marital Status: Married  Catering manager Violence: Not on file    Family History  Problem Relation Age of Onset   Stroke Mother    Stroke Father    Stroke Sister    Cancer Brother    Pancreatic cancer Brother    Colon cancer Neg Hx    Colon polyps Neg Hx    Esophageal cancer Neg Hx    Rectal cancer Neg Hx    Stomach cancer Neg Hx      Review of Systems  Constitutional:  Positive for weight loss. Negative for chills and fever.  HENT: Negative.  Negative for congestion and sore throat.   Respiratory: Negative.  Negative for cough and shortness of breath.   Cardiovascular: Negative.  Negative for chest pain and palpitations.  Gastrointestinal:  Negative for abdominal pain, diarrhea, nausea and vomiting.  Genitourinary: Negative.  Negative for dysuria and hematuria.  Skin: Negative.  Negative for rash.  Neurological: Negative.  Negative for dizziness and headaches.  All other systems reviewed and are negative.   Vitals:   01/16/24 1009  BP: 108/64  Pulse: (!) 48  Temp: 97.8 F (36.6 C)  SpO2: 98%    Physical Exam Vitals reviewed.  Constitutional:      Appearance: Normal appearance.  HENT:     Head: Normocephalic.     Mouth/Throat:     Mouth: Mucous membranes are moist.     Pharynx: Oropharynx is clear.  Eyes:     Extraocular Movements: Extraocular movements intact.     Conjunctiva/sclera: Conjunctivae normal.     Pupils: Pupils are equal, round, and reactive to light.  Cardiovascular:     Rate and Rhythm: Normal rate and regular rhythm.     Pulses: Normal pulses.     Heart sounds: Normal heart sounds.  Pulmonary:     Effort: Pulmonary effort is normal.     Breath sounds: Normal breath sounds.  Abdominal:     Palpations: Abdomen is soft.     Tenderness: There is no abdominal tenderness.  Musculoskeletal:     Cervical back: No tenderness.     Right lower leg: No  edema.     Left lower leg: No edema.  Lymphadenopathy:     Cervical: No cervical adenopathy.  Skin:    General: Skin is warm and dry.     Capillary Refill: Capillary refill takes less than 2 seconds.  Neurological:     General: No focal deficit present.     Mental Status: He is alert and oriented to person, place, and time.  Psychiatric:  Mood and Affect: Mood normal.        Behavior: Behavior normal.    DG Chest 2 View Result Date: 01/16/2024 CLINICAL DATA:  Weight loss. EXAM: DG CHEST 2V COMPARISON:  None Available. FINDINGS: The heart size and mediastinal contours are within normal limits. Both lungs are clear. The visualized skeletal structures are unremarkable. IMPRESSION: No active cardiopulmonary disease. Electronically Signed   By: Lynwood Landy Raddle M.D.   On: 01/16/2024 11:03     ASSESSMENT & PLAN: A total of 44 minutes was spent with the patient and counseling/coordination of care regarding preparing for this visit, review of most recent office visit notes, differential diagnosis of weight loss and need for workup to rule out cancer, review of all medications, review of most recent bloodwork results, review of health maintenance items, education on nutrition, prognosis, documentation, and need for follow up.   Problem List Items Addressed This Visit       Other   Weight loss - Primary   Wt Readings from Last 3 Encounters:  01/16/24 149 lb (67.6 kg)  11/21/23 159 lb 8 oz (72.3 kg)  06/28/23 214 lb 9.6 oz (97.3 kg)  Significant weight loss over the last 6 months. Could be all related to extreme workout regimens and very limited caloric intake but cancer needs to be ruled out. Colonoscopy report from 2022 reviewed.  Given his change in bowel movements and presence of blood in the stool recommend diagnostic colonoscopy. Recommend blood work today.  Urinalysis today. Recommend chest x-ray.  Non-smoker.  Will review images when available. Recommend CT scan of abdomen and  pelvis. Diet and nutrition discussed Will follow-up when workup is completed.       Relevant Orders   Thyroid  Panel With TSH   Urinalysis   Testosterone    CBC with Differential/Platelet   Comprehensive metabolic panel with GFR   Hemoglobin A1c   Lipid panel   VITAMIN D  25 Hydroxy (Vit-D Deficiency, Fractures)   Vitamin B12   HIV Antibody (routine testing w rflx)   Hepatitis C antibody   DG Chest 2 View   CT ABDOMEN PELVIS W WO CONTRAST   PSA   Ambulatory referral to Gastroenterology   Other Visit Diagnoses       Change in bowel movement       Relevant Orders   Ambulatory referral to Gastroenterology     Blood in the stool       Relevant Orders   Ambulatory referral to Gastroenterology      Patient Instructions  Preventing Consequences of Unhealthy Weight Loss Behaviors, Adult Reaching and maintaining a healthy weight is important for your overall health. A healthy weight will vary from person to person. It is natural to want to lose weight quickly, using whatever methods seem fastest. However, losing weight in a healthy way is not a quick process. Instead, aim for slow, steady weight loss by making small changes and setting achievable goals. How can unhealthy weight loss behaviors affect me? Using unhealthy behaviors to try to lose weight can cause: Tiredness (fatigue), low heart rate, and low blood pressure. Imbalances in your body. These may be imbalances in: Electrolytes. These are salts and minerals in your blood. Chemicals. These are needed so your body can work properly. Body fluids. Loss of fluids may lead to dehydration. Organ damage or organ failure, especially affecting the kidneys. Thin bones that break easily. Loneliness or relationship problems with your friends and family. Emotional problems, including depression and anxiety. Changing  unhealthy weight loss behaviors through lifestyle changes will improve your overall health. Maintaining a healthy weight  also lowers your risk of certain conditions, such as: Obesity. Heart disease, high cholesterol, and high blood pressure. Type 2 diabetes. Breathing and sleeping disorders. Stroke. Osteoarthritis. This affects your joints. Osteoporosis. This affects your bones. Some cancers. What can increase my risk? Certain views or feelings about yourself and certain habits can increase your risk of unhealthy weight loss behaviors. These include: Having depression and being overweight as an adult. Attempting weight loss as a child or teen. Using alcohol, drugs, or tobacco products. What actions can I take to prevent these behaviors? You can make certain lifestyle changes to help you lose weight in a healthy way. These include eating nutritious foods and exercising regularly. Nutrition  Eat a variety of healthy foods, including fruits and vegetables, whole grains, lean proteins, and low-fat dairy products. Drink water instead of sugary drinks such as juice, soda, or sports drinks. Drink enough fluid to keep your urine pale yellow. Plan healthy, balanced meals. Work with a Data processing manager to make a healthy meal plan that works for you. Limit the following: Foods that are high in fat, salt (sodium), or sugar. These include candy, donuts, pizza, and fast foods. Fried or heavily processed foods. Lifestyle Avoid these unhealthy eating habits: Following a diet that restricts entire types of food. This may be a popular diet that promises extreme results in a short time. Skipping meals to save calories. Not eating anything for long periods of time (fasting). Restricting your calories to far fewer than the number that you need to lose or maintain a healthy weight. Taking laxative pills to make you have more frequent bowel movements. Taking medicines to make your body lose excess fluids (diuretics). Eating an excessive amount of food and then making yourself vomit. This is known as bingeing and purging. Do not use  any products that contain nicotine or tobacco. These products include cigarettes, chewing tobacco, and vaping devices, such as e-cigarettes. If you need help quitting, ask your health care provider. Alcohol use Do not drink alcohol if: Your health care provider tells you not to drink. You are pregnant, may be pregnant, or are planning to become pregnant. If you drink alcohol: Limit how much you have to: 0-1 drink a day for women. 0-2 drinks a day for men. Know how much alcohol is in your drink. In the [place] one drink equals one 12 oz bottle of beer (355 mL), one 5 oz glass of wine (148 mL), or one 1 oz glass of hard liquor (44 mL). Activity  Avoid compulsively getting an extreme amount of exercise. Work with a Data processing manager to make a healthy exercise program. Include different types of exercise in your exercise program, such as strengthening, aerobic, and flexibility exercises. To maintain your weight, get at least 150 minutes of moderate-intensity exercise each week. Moderate-intensity exercise could be brisk walking or biking. To lose a healthy amount of weight, get 60 minutes of moderate-intensity exercise each day. Find ways to reduce stress, such as regular exercise or meditation. Find a hobby or other activity that you enjoy to distract you from eating when you feel stressed or bored. Where to find support For more support, talk with: Your health care provider or dietitian. Ask about support groups. A mental health care provider. Family and friends. Where to find more information Learn more about how to prevent complications from unhealthy weight loss behaviors from: Centers for Disease Control and Prevention: FootballExhibition.com.br  [employer] of Mental Health: http://www.maynard.net/ National Eating Disorders Association: www.nationaleatingdisorders.org Contact a health care provider if: You often feel very tired. You notice changes in your skin or your hair. You faint because of  dehydration or too much exercise. You struggle to change your unhealthy weight loss behaviors on your own. Unhealthy weight loss behaviors are affecting your daily life or your relationships. You have signs or symptoms of an eating disorder. You have major weight changes in a short period of time. You feel guilty or ashamed about eating or exercising. Summary Using unhealthy eating behaviors to try to lose weight can cause a variety of physical and emotional problems that affect your overall health and well-being. Aim for slow, steady weight loss by choosing healthy foods, avoiding unhealthy eating habits, and exercising regularly. Contact your health care provider if you struggle to change your behaviors on your own or if you think that you may have an eating disorder. This information is not intended to replace advice given to you by your health care provider. Make sure you discuss any questions you have with your health care provider. Document Revised: 12/28/2020 Document Reviewed: 12/28/2020 Elsevier Patient Education  2024 Elsevier Inc.    Emil Schaumann, MD [facility]

## 2024-01-16 NOTE — Telephone Encounter (Signed)
With contrast

## 2024-01-16 NOTE — Telephone Encounter (Signed)
 Order has been changed to with contrast

## 2024-01-16 NOTE — Patient Instructions (Signed)
 Preventing Consequences of Unhealthy Weight Loss Behaviors, Adult Reaching and maintaining a healthy weight is important for your overall health. A healthy weight will vary from person to person. It is natural to want to lose weight quickly, using whatever methods seem fastest. However, losing weight in a healthy way is not a quick process. Instead, aim for slow, steady weight loss by making small changes and setting achievable goals. How can unhealthy weight loss behaviors affect me? Using unhealthy behaviors to try to lose weight can cause: Tiredness (fatigue), low heart rate, and low blood pressure. Imbalances in your body. These may be imbalances in: Electrolytes. These are salts and minerals in your blood. Chemicals. These are needed so your body can work properly. Body fluids. Loss of fluids may lead to dehydration. Organ damage or organ failure, especially affecting the kidneys. Thin bones that break easily. Loneliness or relationship problems with your friends and family. Emotional problems, including depression and anxiety. Changing unhealthy weight loss behaviors through lifestyle changes will improve your overall health. Maintaining a healthy weight also lowers your risk of certain conditions, such as: Obesity. Heart disease, high cholesterol, and high blood pressure. Type 2 diabetes. Breathing and sleeping disorders. Stroke. Osteoarthritis. This affects your joints. Osteoporosis. This affects your bones. Some cancers. What can increase my risk? Certain views or feelings about yourself and certain habits can increase your risk of unhealthy weight loss behaviors. These include: Having depression and being overweight as an adult. Attempting weight loss as a child or teen. Using alcohol, drugs, or tobacco products. What actions can I take to prevent these behaviors? You can make certain lifestyle changes to help you lose weight in a healthy way. These include eating nutritious  foods and exercising regularly. Nutrition  Eat a variety of healthy foods, including fruits and vegetables, whole grains, lean proteins, and low-fat dairy products. Drink water instead of sugary drinks such as juice, soda, or sports drinks. Drink enough fluid to keep your urine pale yellow. Plan healthy, balanced meals. Work with a Data processing manager to make a healthy meal plan that works for you. Limit the following: Foods that are high in fat, salt (sodium), or sugar. These include candy, donuts, pizza, and fast foods. Fried or heavily processed foods. Lifestyle Avoid these unhealthy eating habits: Following a diet that restricts entire types of food. This may be a popular diet that promises extreme results in a short time. Skipping meals to save calories. Not eating anything for long periods of time (fasting). Restricting your calories to far fewer than the number that you need to lose or maintain a healthy weight. Taking laxative pills to make you have more frequent bowel movements. Taking medicines to make your body lose excess fluids (diuretics). Eating an excessive amount of food and then making yourself vomit. This is known as bingeing and purging. Do not use any products that contain nicotine or tobacco. These products include cigarettes, chewing tobacco, and vaping devices, such as e-cigarettes. If you need help quitting, ask your health care provider. Alcohol use Do not drink alcohol if: Your health care provider tells you not to drink. You are pregnant, may be pregnant, or are planning to become pregnant. If you drink alcohol: Limit how much you have to: 0-1 drink a day for women. 0-2 drinks a day for men. Know how much alcohol is in your drink. In the [place] one drink equals one 12 oz bottle of beer (355 mL), one 5 oz glass of wine (148 mL), or one 1  oz glass of hard liquor (44 mL). Activity  Avoid compulsively getting an extreme amount of exercise. Work with a Data processing manager to make a  healthy exercise program. Include different types of exercise in your exercise program, such as strengthening, aerobic, and flexibility exercises. To maintain your weight, get at least 150 minutes of moderate-intensity exercise each week. Moderate-intensity exercise could be brisk walking or biking. To lose a healthy amount of weight, get 60 minutes of moderate-intensity exercise each day. Find ways to reduce stress, such as regular exercise or meditation. Find a hobby or other activity that you enjoy to distract you from eating when you feel stressed or bored. Where to find support For more support, talk with: Your health care provider or dietitian. Ask about support groups. A mental health care provider. Family and friends. Where to find more information Learn more about how to prevent complications from unhealthy weight loss behaviors from: Centers for Disease Control and Prevention: FootballExhibition.com.br [employer] of Mental Health: http://www.maynard.net/ National Eating Disorders Association: www.nationaleatingdisorders.org Contact a health care provider if: You often feel very tired. You notice changes in your skin or your hair. You faint because of dehydration or too much exercise. You struggle to change your unhealthy weight loss behaviors on your own. Unhealthy weight loss behaviors are affecting your daily life or your relationships. You have signs or symptoms of an eating disorder. You have major weight changes in a short period of time. You feel guilty or ashamed about eating or exercising. Summary Using unhealthy eating behaviors to try to lose weight can cause a variety of physical and emotional problems that affect your overall health and well-being. Aim for slow, steady weight loss by choosing healthy foods, avoiding unhealthy eating habits, and exercising regularly. Contact your health care provider if you struggle to change your behaviors on your own or if you think that you may  have an eating disorder. This information is not intended to replace advice given to you by your health care provider. Make sure you discuss any questions you have with your health care provider. Document Revised: 12/28/2020 Document Reviewed: 12/28/2020 Elsevier Patient Education  2024 [employer].

## 2024-01-16 NOTE — Assessment & Plan Note (Signed)
 Wt Readings from Last 3 Encounters:  01/16/24 149 lb (67.6 kg)  11/21/23 159 lb 8 oz (72.3 kg)  06/28/23 214 lb 9.6 oz (97.3 kg)  Significant weight loss over the last 6 months. Could be all related to extreme workout regimens and very limited caloric intake but cancer needs to be ruled out. Colonoscopy report from 2022 reviewed.  Given his change in bowel movements and presence of blood in the stool recommend diagnostic colonoscopy. Recommend blood work today.  Urinalysis today. Recommend chest x-ray.  Non-smoker.  Will review images when available. Recommend CT scan of abdomen and pelvis. Diet and nutrition discussed Will follow-up when workup is completed.

## 2024-01-16 NOTE — Addendum Note (Signed)
 Addended by: GALA GALLEY on: 01/16/2024 04:50 PM   Modules accepted: Orders

## 2024-01-16 NOTE — Telephone Encounter (Signed)
 Copied from CRM #[id number]. Topic: General - Other >> Jan 16, 2024 12:29 PM Aleatha C wrote: Reason for CRM: Sonny from [facility] imaging called to advise for patient order for Ct abdomen  pelvis with or without contrast, she says its has to be one or the other so it would need to be changed call back [phone] option 1 then option 4

## 2024-01-17 LAB — HEPATITIS C ANTIBODY: Hepatitis C Ab: NONREACTIVE

## 2024-01-17 LAB — THYROID PANEL WITH TSH
Free Thyroxine Index: 2.5 (ref 1.4–3.8)
T3 Uptake: 31 % (ref 22–35)
T4, Total: 8.2 ug/dL (ref 4.9–10.5)
TSH: 1.99 m[IU]/L (ref 0.40–4.50)

## 2024-01-17 LAB — HIV ANTIBODY (ROUTINE TESTING W REFLEX): HIV 1&2 Ab, 4th Generation: NONREACTIVE

## 2024-01-18 ENCOUNTER — Ambulatory Visit: Payer: Self-pay | Admitting: Emergency Medicine

## 2024-01-22 ENCOUNTER — Inpatient Hospital Stay: Admission: RE | Admit: 2024-01-22 | Source: Ambulatory Visit

## 2024-01-22 NOTE — Telephone Encounter (Signed)
 Spoke with patient and informed him CT has been sent to [facility] and should be receiving a call soon

## 2024-01-22 NOTE — Telephone Encounter (Signed)
 New referral sent to [facility] and patient has been in formed

## 2024-01-22 NOTE — Telephone Encounter (Unsigned)
 Copied from CRM #[id number]. Topic: General - Other >> Jan 22, 2024 10:48 AM Deleta RAMAN wrote: Reason for CRM: patient is calling because insurance was denied for ct scan appointment. The office canceled his appointment and notified patient they will contact pcp office regarding payment plan or arrangement for him to been

## 2024-01-22 NOTE — Addendum Note (Signed)
 Addended by: GALA GALLEY on: 01/22/2024 02:41 PM   Modules accepted: Orders

## 2024-01-23 ENCOUNTER — Encounter: Payer: Self-pay | Admitting: Emergency Medicine

## 2024-01-23 NOTE — Telephone Encounter (Signed)
 Sorry I do not have any recommendations.

## 2024-02-05 ENCOUNTER — Telehealth: Payer: Self-pay | Admitting: Radiology

## 2024-02-05 NOTE — Telephone Encounter (Signed)
 Copied from CRM [id number]. Topic: General - Other >> Feb 05, 2024 11:57 AM Chiquita SQUIBB wrote: Reason for CRM: Patient is calling in because he tried to schedule his CT scan and they had two orders in one with contrast and one without contrast, and was not sure what one they needed to do. Patient is trying to get this scheduled asap. Please advise.

## 2024-02-05 NOTE — Addendum Note (Signed)
 Addended by: GALA GALLEY on: 02/05/2024 02:24 PM   Modules accepted: Orders

## 2024-02-05 NOTE — Telephone Encounter (Signed)
 Spoke with patient and informed him his CT scan is with contrast

## 2024-02-12 ENCOUNTER — Ambulatory Visit (HOSPITAL_COMMUNITY)

## 2024-02-12 ENCOUNTER — Other Ambulatory Visit: Payer: Self-pay | Admitting: Radiology

## 2024-02-12 DIAGNOSIS — R198 Other specified symptoms and signs involving the digestive system and abdomen: Secondary | ICD-10-CM

## 2024-02-12 DIAGNOSIS — K921 Melena: Secondary | ICD-10-CM

## 2024-02-12 DIAGNOSIS — R634 Abnormal weight loss: Secondary | ICD-10-CM

## 2024-02-12 NOTE — Telephone Encounter (Signed)
 The reason to get the CT scan is for unintentional weight loss and rule out cancer.  Please place referral to oncologist for this issue.

## 2024-02-13 NOTE — Telephone Encounter (Signed)
 Patient has been informed oncology referral has been placed. And should be expecting a call sometime this week. He understood

## 2024-02-19 NOTE — Telephone Encounter (Unsigned)
 Copied from CRM [id number]. Topic: Referral - Status >> Feb 19, 2024  2:49 PM Chiquita SQUIBB wrote: Reason for CRM: Meria from [facility], she stated that they would need imagining done first to accompany the referral, before scheduling the patient. Please advise Hadassah at [phone] with any questions

## 2024-02-22 NOTE — Telephone Encounter (Signed)
 Meria to call back after reviewing notes again with oncology. States pcp should consider a colonoscopy

## 2024-02-25 ENCOUNTER — Telehealth: Payer: Self-pay

## 2024-02-25 NOTE — Telephone Encounter (Signed)
 Copied from CRM [id number]. Topic: General - Other >> Feb 25, 2024  4:03 PM Jasmin G wrote: Reason for CRM: Pt requested a call back from Ms. Gala Galley, CMA or any of Dr. Lebron team to discuss recent issues with trying to reach Oncology clinic from recent referral made. Call pt back at [phone].

## 2024-02-28 ENCOUNTER — Encounter: Payer: Self-pay | Admitting: Emergency Medicine

## 2024-02-28 NOTE — Telephone Encounter (Signed)
 Copied from CRM [id number]. Topic: Referral - Status >> Feb 19, 2024  2:49 PM Chiquita SQUIBB wrote: Reason for CRM: Meria from [facility], she stated that they would need imagining done first to accompany the referral, before scheduling the patient. Please advise Hadassah at [phone] with any questions >> Feb 28, 2024  8:06 AM Thersia BROCKS wrote: Patient called in would like a callback regarding this, stated he has called a couple of times and is not getting any answers  >> Feb 21, 2024 11:31 AM [place] A wrote: Patient would like to speak to CMA of Primary to explain what is occurring with the Oncologist Referral

## 2024-02-29 ENCOUNTER — Other Ambulatory Visit: Payer: Self-pay | Admitting: Emergency Medicine

## 2024-02-29 ENCOUNTER — Encounter: Payer: Self-pay | Admitting: Gastroenterology

## 2024-02-29 DIAGNOSIS — R634 Abnormal weight loss: Secondary | ICD-10-CM

## 2024-02-29 NOTE — Telephone Encounter (Signed)
 The issue with Antonio Moore is excessive weight loss.  No other symptoms, otherwise doing well.  He started an intense weight loss program along with intense daily workout activity, probably excessive.  As a result he has lost a lot of weight probably more than he wanted to.  I saw him 01/16/2024 and on that day he had chest x-ray and extensive blood work and everything looked normal.  His physical exam was reassuring.  As part of the weight loss workup we also requested CT scan of the abdomen and pelvis which was denied.  Part of the reason stated on the denial letter mentions lack of necessary workup including blood work.  However this workup has been done.  Recommend to appeal this decision.  In the meantime I will order an abdominal ultrasound and also recommend referral to medical weight management clinic.

## 2024-03-06 ENCOUNTER — Encounter (INDEPENDENT_AMBULATORY_CARE_PROVIDER_SITE_OTHER): Payer: Self-pay

## 2024-03-10 ENCOUNTER — Ambulatory Visit
Admission: RE | Admit: 2024-03-10 | Discharge: 2024-03-10 | Disposition: A | Discharge status: Home / Self Care | Source: Ambulatory Visit | Attending: Emergency Medicine | Admitting: Emergency Medicine

## 2024-03-10 DIAGNOSIS — R634 Abnormal weight loss: Secondary | ICD-10-CM

## 2024-03-11 ENCOUNTER — Ambulatory Visit: Payer: Self-pay | Admitting: Emergency Medicine

## 2024-03-12 NOTE — Telephone Encounter (Signed)
 This is not sarcoidosis.  Abdominal cramping is usually a response to something we are eating or drinking.  If he wants to see a GI doctor we can refer him to one.  Thanks.

## 2024-03-13 ENCOUNTER — Encounter: Payer: Self-pay | Admitting: Emergency Medicine

## 2024-04-18 ENCOUNTER — Ambulatory Visit: Admitting: Gastroenterology

## 2024-05-16 ENCOUNTER — Ambulatory Visit: Admitting: Gastroenterology

## 2024-05-19 ENCOUNTER — Ambulatory Visit: Admitting: Gastroenterology
